# Patient Record
Sex: Male | Born: 1950 | Race: White | Hispanic: No | Marital: Single | Smoking: Light tobacco smoker
Health system: Western US, Academic
[De-identification: ages and names within clinical notes are randomized; demographics above are authoritative.]

## PROBLEM LIST (undated history)

## (undated) DIAGNOSIS — Z952 Presence of prosthetic heart valve: Secondary | ICD-10-CM

## (undated) DIAGNOSIS — J449 Chronic obstructive pulmonary disease, unspecified: Secondary | ICD-10-CM

## (undated) DIAGNOSIS — Z951 Presence of aortocoronary bypass graft: Secondary | ICD-10-CM

## (undated) DIAGNOSIS — Z95810 Presence of automatic (implantable) cardiac defibrillator: Secondary | ICD-10-CM

## (undated) DIAGNOSIS — I5022 Chronic systolic (congestive) heart failure: Secondary | ICD-10-CM

## (undated) DIAGNOSIS — I2510 Atherosclerotic heart disease of native coronary artery without angina pectoris: Secondary | ICD-10-CM

## (undated) DIAGNOSIS — I255 Ischemic cardiomyopathy: Secondary | ICD-10-CM

## (undated) DIAGNOSIS — Z7901 Long term (current) use of anticoagulants: Secondary | ICD-10-CM

## (undated) DIAGNOSIS — I739 Peripheral vascular disease, unspecified: Secondary | ICD-10-CM

## (undated) DIAGNOSIS — I6529 Occlusion and stenosis of unspecified carotid artery: Secondary | ICD-10-CM

## (undated) DIAGNOSIS — J189 Pneumonia, unspecified organism: Secondary | ICD-10-CM

## (undated) DIAGNOSIS — C801 Malignant (primary) neoplasm, unspecified: Secondary | ICD-10-CM

## (undated) DIAGNOSIS — N200 Calculus of kidney: Secondary | ICD-10-CM

## (undated) DIAGNOSIS — C61 Malignant neoplasm of prostate: Secondary | ICD-10-CM

## (undated) DIAGNOSIS — I10 Essential (primary) hypertension: Secondary | ICD-10-CM

## (undated) HISTORY — DX: Presence of prosthetic heart valve: Z95.2

## (undated) HISTORY — DX: Peripheral vascular disease, unspecified: I73.9

## (undated) HISTORY — DX: Occlusion and stenosis of unspecified carotid artery: I65.29

## (undated) HISTORY — DX: Chronic obstructive pulmonary disease, unspecified: J44.9

## (undated) HISTORY — DX: Long term (current) use of anticoagulants: Z79.01

## (undated) HISTORY — DX: Ischemic cardiomyopathy: I25.5

## (undated) HISTORY — DX: Atherosclerotic heart disease of native coronary artery without angina pectoris: I25.10

## (undated) HISTORY — DX: Presence of aortocoronary bypass graft: Z95.1

## (undated) HISTORY — DX: Presence of automatic (implantable) cardiac defibrillator: Z95.810

## (undated) HISTORY — DX: Chronic systolic (congestive) heart failure: I50.22

## (undated) HISTORY — PX: HERNIA REPAIR: SHX51

## (undated) HISTORY — PX: OTHER SURGICAL HISTORY: SHX169

## (undated) HISTORY — PX: TONSILLECTOMY: SUR1361

## (undated) HISTORY — PX: APPENDECTOMY: SHX54

---

## 2003-02-24 ENCOUNTER — Encounter: Payer: Self-pay | Admitting: General Surgery

## 2003-02-24 ENCOUNTER — Ambulatory Visit (HOSPITAL_COMMUNITY): Admission: RE | Admit: 2003-02-24 | Discharge: 2003-02-24 | Payer: Self-pay | Admitting: General Surgery

## 2011-06-08 DIAGNOSIS — Z947 Corneal transplant status: Secondary | ICD-10-CM | POA: Insufficient documentation

## 2011-06-08 DIAGNOSIS — H25819 Combined forms of age-related cataract, unspecified eye: Secondary | ICD-10-CM | POA: Insufficient documentation

## 2013-11-18 DIAGNOSIS — Z951 Presence of aortocoronary bypass graft: Secondary | ICD-10-CM

## 2013-11-18 HISTORY — PX: CABG UNSPEC # VESSELS - HX ONLY: CABG0

## 2013-11-18 HISTORY — DX: Presence of aortocoronary bypass graft: Z95.1

## 2014-10-11 DIAGNOSIS — H17813 Minor opacity of cornea, bilateral: Secondary | ICD-10-CM | POA: Insufficient documentation

## 2015-01-31 ENCOUNTER — Other Ambulatory Visit: Payer: Self-pay | Admitting: Urology

## 2015-02-28 NOTE — Patient Instructions (Signed)
Joshua Walters  02/28/2015   Your procedure is scheduled on: 03/14/2015  Report to [facility] Main  Entrance take [facility]  elevators to 3rd floor to  [place] at     0900 AM.  Call this number if you have problems the morning of surgery [phone]   Remember: ONLY 1 PERSON MAY GO WITH YOU TO SHORT STAY TO GET  READY MORNING OF [place].  Do not eat food or drink liquids :After Midnight.     Take these medicines the morning of surgery with A SIP OF WATER: none  DO NOT TAKE ANY DIABETIC MEDICATIONS DAY OF YOUR SURGERY                               You may not have any metal on your body including hair pins and              piercings  Do not wear jewelry, , lotions, powders or perfumes, deodorant                         Men may shave face and neck.   Do not bring valuables to the hospital. [place].  Contacts, dentures or bridgework may not be worn into surgery.  Leave suitcase in the car. After surgery it may be brought to your room.      Special Instructions: coughing and deep breathing exercises, leg exercises               Please read over the following fact sheets you were given: _____________________________________________________________________             [facility] - Preparing for Surgery Before surgery, you can play an important role.  Because skin is not sterile, your skin needs to be as free of germs as possible.  You can reduce the number of germs on your skin by washing with CHG (chlorahexidine gluconate) soap before surgery.  CHG is an antiseptic cleaner which kills germs and bonds with the skin to continue killing germs even after washing. Please DO NOT use if you have an allergy to CHG or antibacterial soaps.  If your skin becomes reddened/irritated stop using the CHG and inform your nurse when you arrive at Short Stay. Do not shave (including legs and underarms) for at  least 48 hours prior to the first CHG shower.  You may shave your face/neck. Please follow these instructions carefully:  1.  Shower with CHG Soap the night before surgery and the  morning of Surgery.  2.  If you choose to wash your hair, wash your hair first as usual with your  normal  shampoo.  3.  After you shampoo, rinse your hair and body thoroughly to remove the  shampoo.                           4.  Use CHG as you would any other liquid soap.  You can apply chg directly  to the skin and wash                       Gently with a scrungie or  clean washcloth.  5.  Apply the CHG Soap to your body ONLY FROM THE NECK DOWN.   Do not use on face/ open                           Wound or open sores. Avoid contact with eyes, ears mouth and genitals (private parts).                       Wash face,  Genitals (private parts) with your normal soap.             6.  Wash thoroughly, paying special attention to the area where your surgery  will be performed.  7.  Thoroughly rinse your body with warm water from the neck down.  8.  DO NOT shower/wash with your normal soap after using and rinsing off  the CHG Soap.                9.  Pat yourself dry with a clean towel.            10.  Wear clean pajamas.            11.  Place clean sheets on your bed the night of your first shower and do not  sleep with pets. Day of Surgery : Do not apply any lotions/deodorants the morning of surgery.  Please wear clean clothes to the hospital/surgery center.  FAILURE TO FOLLOW THESE INSTRUCTIONS MAY RESULT IN THE CANCELLATION OF YOUR SURGERY PATIENT SIGNATURE_________________________________  NURSE SIGNATURE__________________________________  ________________________________________________________________________  WHAT IS A BLOOD TRANSFUSION? Blood Transfusion Information  A transfusion is the replacement of blood or some of its parts. Blood is made up of multiple cells which provide different functions.  Red  blood cells carry oxygen and are used for blood loss replacement.  White blood cells fight against infection.  Platelets control bleeding.  Plasma helps clot blood.  Other blood products are available for specialized needs, such as hemophilia or other clotting disorders. BEFORE THE TRANSFUSION  Who gives blood for transfusions?   Healthy volunteers who are fully evaluated to make sure their blood is safe. This is blood bank blood. Transfusion therapy is the safest it has ever been in the practice of medicine. Before blood is taken from a donor, a complete history is taken to make sure that person has no history of diseases nor engages in risky social behavior (examples are intravenous drug use or sexual activity with multiple partners). The donor's travel history is screened to minimize risk of transmitting infections, such as malaria. The donated blood is tested for signs of infectious diseases, such as HIV and hepatitis. The blood is then tested to be sure it is compatible with you in order to minimize the chance of a transfusion reaction. If you or a relative donates blood, this is often done in anticipation of surgery and is not appropriate for emergency situations. It takes many days to process the donated blood. RISKS AND COMPLICATIONS Although transfusion therapy is very safe and saves many lives, the main dangers of transfusion include:  1. Getting an infectious disease. 2. Developing a transfusion reaction. This is an allergic reaction to something in the blood you were given. Every precaution is taken to prevent this. The decision to have a blood transfusion has been considered carefully by your caregiver before blood is given. Blood is not given unless the benefits outweigh the risks. AFTER THE TRANSFUSION  Right after receiving a blood transfusion, you will usually feel much better and more energetic. This is especially true if your red blood cells have gotten low (anemic). The  transfusion raises the level of the red blood cells which carry oxygen, and this usually causes an energy increase.  The nurse administering the transfusion will monitor you carefully for complications. HOME CARE INSTRUCTIONS  No special instructions are needed after a transfusion. You may find your energy is better. Speak with your caregiver about any limitations on activity for underlying diseases you may have. SEEK MEDICAL CARE IF:   Your condition is not improving after your transfusion.  You develop redness or irritation at the intravenous (IV) site. SEEK IMMEDIATE MEDICAL CARE IF:  Any of the following symptoms occur over the next 12 hours:  Shaking chills.  You have a temperature by mouth above 102 F (38.9 C), not controlled by medicine.  Chest, back, or muscle pain.  People around you feel you are not acting correctly or are confused.  Shortness of breath or difficulty breathing.  Dizziness and fainting.  You get a rash or develop hives.  You have a decrease in urine output.  Your urine turns a dark color or changes to pink, red, or brown. Any of the following symptoms occur over the next 10 days:  You have a temperature by mouth above 102 F (38.9 C), not controlled by medicine.  Shortness of breath.  Weakness after normal activity.  The white part of the eye turns yellow (jaundice).  You have a decrease in the amount of urine or are urinating less often.  Your urine turns a dark color or changes to pink, red, or brown. Document Released: 05/18/2000 Document Revised: 08/13/2011 Document Reviewed: 01/05/2008 ExitCare Patient Information 2014 [place].  _______________________________________________________________________  Incentive Spirometer  An incentive spirometer is a tool that can help keep your lungs clear and active. This tool measures how well you are filling your lungs with each breath. Taking long deep breaths may help reverse or  decrease the chance of developing breathing (pulmonary) problems (especially infection) following:  A long period of time when you are unable to move or be active. BEFORE THE PROCEDURE   If the spirometer includes an indicator to show your best effort, your nurse or respiratory therapist will set it to a desired goal.  If possible, sit up straight or lean slightly forward. Try not to slouch.  Hold the incentive spirometer in an upright position. INSTRUCTIONS FOR USE  3. Sit on the edge of your bed if possible, or sit up as far as you can in bed or on a chair. 4. Hold the incentive spirometer in an upright position. 5. Breathe out normally. 6. Place the mouthpiece in your mouth and seal your lips tightly around it. 7. Breathe in slowly and as deeply as possible, raising the piston or the ball toward the top of the column. 8. Hold your breath for 3-5 seconds or for as long as possible. Allow the piston or ball to fall to the bottom of the column. 9. Remove the mouthpiece from your mouth and breathe out normally. 10. Rest for a few seconds and repeat Steps 1 through 7 at least 10 times every 1-2 hours when you are awake. Take your time and take a few normal breaths between deep breaths. 11. The spirometer may include an indicator to show your best effort. Use the indicator as a goal to work toward during each repetition. 12. After  each set of 10 deep breaths, practice coughing to be sure your lungs are clear. If you have an incision (the cut made at the time of surgery), support your incision when coughing by placing a pillow or rolled up towels firmly against it. Once you are able to get out of bed, walk around indoors and cough well. You may stop using the incentive spirometer when instructed by your caregiver.  RISKS AND COMPLICATIONS  Take your time so you do not get dizzy or light-headed.  If you are in pain, you may need to take or ask for pain medication before doing incentive  spirometry. It is harder to take a deep breath if you are having pain. AFTER USE  Rest and breathe slowly and easily.  It can be helpful to keep track of a log of your progress. Your caregiver can provide you with a simple table to help with this. If you are using the spirometer at home, follow these instructions: [place] IF:   You are having difficultly using the spirometer.  You have trouble using the spirometer as often as instructed.  Your pain medication is not giving enough relief while using the spirometer.  You develop fever of 100.5 F (38.1 C) or higher. SEEK IMMEDIATE MEDICAL CARE IF:   You cough up bloody sputum that had not been present before.  You develop fever of 102 F (38.9 C) or greater.  You develop worsening pain at or near the incision site. MAKE SURE YOU:   Understand these instructions.  Will watch your condition.  Will get help right away if you are not doing well or get worse. Document Released: 10/01/2006 Document Revised: 08/13/2011 Document Reviewed: 12/02/2006 [facility] Patient Information 2014 [place], [place].   ________________________________________________________________________

## 2015-03-03 ENCOUNTER — Encounter (HOSPITAL_COMMUNITY): Payer: Self-pay

## 2015-03-03 ENCOUNTER — Encounter (HOSPITAL_COMMUNITY)
Admission: RE | Admit: 2015-03-03 | Discharge: 2015-03-03 | Disposition: A | Discharge status: Home / Self Care | Payer: BLUE CROSS/BLUE SHIELD | Source: Ambulatory Visit | Attending: Urology | Admitting: Urology

## 2015-03-03 DIAGNOSIS — Z01818 Encounter for other preprocedural examination: Secondary | ICD-10-CM | POA: Diagnosis present

## 2015-03-03 DIAGNOSIS — C61 Malignant neoplasm of prostate: Secondary | ICD-10-CM | POA: Insufficient documentation

## 2015-03-03 HISTORY — DX: Essential (primary) hypertension: I10

## 2015-03-03 HISTORY — DX: Calculus of kidney: N20.0

## 2015-03-03 HISTORY — DX: Pneumonia, unspecified organism: J18.9

## 2015-03-03 HISTORY — DX: Malignant (primary) neoplasm, unspecified: C80.1

## 2015-03-03 LAB — BASIC METABOLIC PANEL
Anion gap: 8 (ref 5–15)
BUN: 12 mg/dL (ref 6–20)
CHLORIDE: 105 mmol/L (ref 101–111)
CO2: 27 mmol/L (ref 22–32)
Calcium: 9.6 mg/dL (ref 8.9–10.3)
Creatinine, Ser: 0.89 mg/dL (ref 0.61–1.24)
GFR calc non Af Amer: >60 mL/min (ref >60)
Glucose, Bld: 105 mg/dL — ABNORMAL HIGH (ref 65–99)
POTASSIUM: 4.3 mmol/L (ref 3.5–5.1)
SODIUM: 140 mmol/L (ref 135–145)

## 2015-03-03 LAB — CBC
HEMATOCRIT: 43.9 % (ref 39.0–52.0)
Hemoglobin: 14.4 g/dL (ref 13.0–17.0)
MCH: 31.5 pg (ref 26.0–34.0)
MCHC: 32.8 g/dL (ref 30.0–36.0)
MCV: 96.1 fL (ref 78.0–100.0)
PLATELETS: 228 10*3/uL (ref 150–400)
RBC: 4.57 MIL/uL (ref 4.22–5.81)
RDW: 13 % (ref 11.5–15.5)
WBC: 6.8 10*3/uL (ref 4.0–10.5)

## 2015-03-03 LAB — TYPE AND SCREEN
ABO/RH(D): A POS
ANTIBODY SCREEN: NEGATIVE

## 2015-03-03 LAB — ABO/RH: ABO/RH(D): A POS

## 2015-03-07 NOTE — Progress Notes (Signed)
Final EKG in EPIC done 03/03/2015.

## 2015-03-11 NOTE — H&P (Signed)
Chief Complaint Prostate Cancer    History of Present Illness Joshua Walters is a 64 year old gentleman who was found to have an elevated PSA of 4.8 prompting urologic evaluation and a prostate needle biopsy on 11/25/14. This demonstrated Gleason 3+4=7 adenocarcinoma of the prostate with 6 out of 17 biopsy cores positive for malignancy. He has no family history of prostate cancer. He is very healthy with hypertension being his only medical problem. He is well informed about his treatment options after prior discussion with Dr. Exie Parody. He is kindly seen today at the request of Dr. Exie Parody specifically to consider surgical treatment. Mr. and Mrs. Kassim do have full custody of their 52-year-old daughter who has Down's syndrome and his priority is to proceed with aggressive therapy in order to prolong his quantity of life.     TNM stage: cT1c Nx Mx  PSA: 4.8  Gleason score: 3+4=7  Biopsy (01/21/15): 6/17 cores (20% of tissue overall)    Left: L apex (50%), L mid (70%), L base (5-10%)    Right: No malignancy  Prostate volume: 43.6 cc    Nomogram  OC disease: 48%  EPE: 51%  SVI: 2%  LNI: 2%  PFS (surgery): 90% at 5 years, 83% at 10 years    Urinary function: He does take tamsulosin although states that he has fairly minimal lower urinary tract symptoms. IPSS is 2.  Erectile function: He does have mild erectile dysfunction although candidate came adequate erections on a regular basis without the need for medication. SHIM score is 19.   Past Medical History Problems  1. History of hyperlipidemia (Z86.39) 2. History of hypertension (Z86.79)  Surgical History Problems  1. History of Appendectomy 2. History of Cornea Transplant 3. History of Hernia Repair 4. History of Tonsillectomy  Current Meds 1. Aspir-81 81 MG Oral Tablet Delayed Release;  Therapy: (Recorded:26Aug2016) to Recorded 2. Fish Oil Concentrate 1000 MG Oral Capsule;  Therapy: (Recorded:26Aug2016) to  Recorded 3. Lisinopril 10 MG Oral Tablet;  Therapy: (Recorded:26Aug2016) to Recorded 4. Multi-Vitamin TABS;  Therapy: (Recorded:26Aug2016) to Recorded 5. Simvastatin 40 MG Oral Tablet;  Therapy: (Recorded:26Aug2016) to Recorded 6. Tamsulosin HCl - 0.4 MG Oral Capsule;  Therapy: (Recorded:26Aug2016) to Recorded 7. Vitamin D-3 5000 UNIT TABS;  Therapy: (Recorded:26Aug2016) to Recorded  Allergies Medication  1. Penicillins 2. Sulfa Drugs  Family History Problems  1. Family history of colon cancer (Z80.0) : Mother 2. Family history of Systemic inflammatory response syndrome (SIRS) of noninfectious  origin without organ failure : Father  Social History Problems    Alcohol use (Z78.9)   couple of beers, not every day   Former smoker [phone])   quit smoking 2004   Married  Review of Systems Genitourinary, constitutional, skin, eye, otolaryngeal, hematologic/lymphatic, cardiovascular, pulmonary, endocrine, musculoskeletal, gastrointestinal, neurological and psychiatric system(s) were reviewed and pertinent findings if present are noted and are otherwise negative.      Physical Exam Constitutional: Well nourished and well developed . No acute distress.  ENT:. The ears and nose are normal in appearance.  Neck: The appearance of the neck is normal and no neck mass is present.  Pulmonary: No respiratory distress, normal respiratory rhythm and effort and clear bilateral breath sounds.  Cardiovascular: Heart rate and rhythm are normal . No peripheral edema.  Abdomen: Incision site(s) well healed. The abdomen is soft and nontender. No masses are palpated. No CVA tenderness. No hernias are palpable. No hepatosplenomegaly noted.   Assessment Assessed  1. Prostate cancer (C61)  Discussion/Summary 1. Prostate cancer:    Joshua Walters feels very well informed. He does adamantly wish to proceed with surgical treatment. He will undergo a bilateral nerve sparing robot-assisted  laparoscopic radical prostatectomy and bilateral pelvic lymphadenectomy.

## 2015-03-13 MED ORDER — VANCOMYCIN HCL IN DEXTROSE 1-5 GM/200ML-% IV SOLN
1000.0000 mg | INTRAVENOUS | Status: AC
  Administered 2015-03-14: 1000 mg via INTRAVENOUS
  Filled 2015-03-13: qty 200

## 2015-03-14 ENCOUNTER — Inpatient Hospital Stay (HOSPITAL_COMMUNITY): Payer: BLUE CROSS/BLUE SHIELD | Admitting: Anesthesiology

## 2015-03-14 ENCOUNTER — Inpatient Hospital Stay (HOSPITAL_COMMUNITY)
Admission: RE | Admit: 2015-03-14 | Discharge: 2015-03-15 | DRG: 708 | Disposition: A | Discharge status: Home / Self Care | Payer: BLUE CROSS/BLUE SHIELD | Source: Ambulatory Visit | Attending: Urology | Admitting: Urology

## 2015-03-14 ENCOUNTER — Encounter (HOSPITAL_COMMUNITY)
Admission: RE | Disposition: A | Discharge status: Home / Self Care | Payer: Self-pay | Source: Ambulatory Visit | Attending: Urology

## 2015-03-14 ENCOUNTER — Encounter (HOSPITAL_COMMUNITY): Payer: Self-pay

## 2015-03-14 DIAGNOSIS — Z947 Corneal transplant status: Secondary | ICD-10-CM | POA: Diagnosis not present

## 2015-03-14 DIAGNOSIS — Z800 Family history of malignant neoplasm of digestive organs: Secondary | ICD-10-CM | POA: Diagnosis not present

## 2015-03-14 DIAGNOSIS — Z01810 Encounter for preprocedural cardiovascular examination: Secondary | ICD-10-CM | POA: Diagnosis not present

## 2015-03-14 DIAGNOSIS — Z79899 Other long term (current) drug therapy: Secondary | ICD-10-CM

## 2015-03-14 DIAGNOSIS — C61 Malignant neoplasm of prostate: Secondary | ICD-10-CM | POA: Diagnosis present

## 2015-03-14 DIAGNOSIS — Z882 Allergy status to sulfonamides status: Secondary | ICD-10-CM | POA: Diagnosis not present

## 2015-03-14 DIAGNOSIS — Z23 Encounter for immunization: Secondary | ICD-10-CM | POA: Diagnosis not present

## 2015-03-14 DIAGNOSIS — Z7982 Long term (current) use of aspirin: Secondary | ICD-10-CM

## 2015-03-14 DIAGNOSIS — Z9049 Acquired absence of other specified parts of digestive tract: Secondary | ICD-10-CM | POA: Diagnosis not present

## 2015-03-14 DIAGNOSIS — I10 Essential (primary) hypertension: Secondary | ICD-10-CM | POA: Diagnosis present

## 2015-03-14 DIAGNOSIS — Z87891 Personal history of nicotine dependence: Secondary | ICD-10-CM | POA: Diagnosis not present

## 2015-03-14 DIAGNOSIS — Z880 Allergy status to penicillin: Secondary | ICD-10-CM | POA: Diagnosis not present

## 2015-03-14 DIAGNOSIS — N529 Male erectile dysfunction, unspecified: Secondary | ICD-10-CM | POA: Diagnosis present

## 2015-03-14 DIAGNOSIS — Z01812 Encounter for preprocedural laboratory examination: Secondary | ICD-10-CM | POA: Diagnosis not present

## 2015-03-14 DIAGNOSIS — E785 Hyperlipidemia, unspecified: Secondary | ICD-10-CM | POA: Diagnosis present

## 2015-03-14 HISTORY — PX: ROBOT ASSISTED LAPAROSCOPIC RADICAL PROSTATECTOMY: SHX5141

## 2015-03-14 HISTORY — PX: LYMPHADENECTOMY: SHX5960

## 2015-03-14 LAB — HEMOGLOBIN AND HEMATOCRIT, BLOOD
HCT: 43 % (ref 39.0–52.0)
HEMOGLOBIN: 13.7 g/dL (ref 13.0–17.0)

## 2015-03-14 SURGERY — ROBOTIC ASSISTED LAPAROSCOPIC RADICAL PROSTATECTOMY LEVEL 2
Anesthesia: General

## 2015-03-14 MED ORDER — PROPOFOL 10 MG/ML IV BOLUS
INTRAVENOUS | Status: AC
  Filled 2015-03-14: qty 20

## 2015-03-14 MED ORDER — HYDROMORPHONE HCL 1 MG/ML IJ SOLN: 0.2500 mg | INTRAMUSCULAR | Status: DC | PRN

## 2015-03-14 MED ORDER — HYDROMORPHONE HCL 1 MG/ML IJ SOLN
INTRAMUSCULAR | Status: DC | PRN
  Administered 2015-03-14 (×2): 1 mg via INTRAVENOUS

## 2015-03-14 MED ORDER — SIMVASTATIN 40 MG PO TABS
40.0000 mg | ORAL_TABLET | Freq: Every day | ORAL | Status: DC
  Administered 2015-03-14: 40 mg via ORAL
  Filled 2015-03-14: qty 1

## 2015-03-14 MED ORDER — BUPIVACAINE-EPINEPHRINE 0.25% -1:200000 IJ SOLN
INTRAMUSCULAR | Status: DC | PRN
  Administered 2015-03-14: 20 mL

## 2015-03-14 MED ORDER — DEXAMETHASONE SODIUM PHOSPHATE 10 MG/ML IJ SOLN
INTRAMUSCULAR | Status: DC | PRN
  Administered 2015-03-14: 10 mg via INTRAVENOUS

## 2015-03-14 MED ORDER — SODIUM CHLORIDE 0.9 % IV BOLUS (SEPSIS)
1000.0000 mL | Freq: Once | INTRAVENOUS | Status: AC
  Administered 2015-03-14: 1000 mL via INTRAVENOUS

## 2015-03-14 MED ORDER — DOCUSATE SODIUM 100 MG PO CAPS
100.0000 mg | ORAL_CAPSULE | Freq: Two times a day (BID) | ORAL | Status: DC
  Administered 2015-03-14 – 2015-03-15 (×2): 100 mg via ORAL
  Filled 2015-03-14 (×2): qty 1

## 2015-03-14 MED ORDER — PROMETHAZINE HCL 25 MG/ML IJ SOLN: 6.2500 mg | INTRAMUSCULAR | Status: DC | PRN

## 2015-03-14 MED ORDER — VANCOMYCIN HCL IN DEXTROSE 1-5 GM/200ML-% IV SOLN
1000.0000 mg | Freq: Two times a day (BID) | INTRAVENOUS | Status: AC
  Administered 2015-03-14: 1000 mg via INTRAVENOUS
  Filled 2015-03-14: qty 200

## 2015-03-14 MED ORDER — MORPHINE SULFATE (PF) 2 MG/ML IV SOLN: 2.0000 mg | INTRAVENOUS | Status: DC | PRN

## 2015-03-14 MED ORDER — PROPOFOL 10 MG/ML IV BOLUS
INTRAVENOUS | Status: DC | PRN
  Administered 2015-03-14: 300 mg via INTRAVENOUS

## 2015-03-14 MED ORDER — STERILE WATER FOR IRRIGATION IR SOLN
Status: DC | PRN
  Administered 2015-03-14: 1000 mL

## 2015-03-14 MED ORDER — ACETAMINOPHEN 325 MG PO TABS: 650.0000 mg | ORAL_TABLET | ORAL | Status: DC | PRN

## 2015-03-14 MED ORDER — PHENYLEPHRINE HCL 10 MG/ML IJ SOLN
INTRAMUSCULAR | Status: DC | PRN
Start: 2015-03-14 — End: 2015-03-14
  Administered 2015-03-14 (×5): 80 ug via INTRAVENOUS

## 2015-03-14 MED ORDER — LACTATED RINGERS IV SOLN
INTRAVENOUS | Status: DC | PRN
  Administered 2015-03-14: 12:00:00

## 2015-03-14 MED ORDER — PHENYLEPHRINE HCL 10 MG/ML IJ SOLN
INTRAMUSCULAR | Status: AC
  Filled 2015-03-14: qty 1

## 2015-03-14 MED ORDER — FENTANYL CITRATE (PF) 250 MCG/5ML IJ SOLN
INTRAMUSCULAR | Status: AC
  Filled 2015-03-14: qty 25

## 2015-03-14 MED ORDER — KETOROLAC TROMETHAMINE 15 MG/ML IJ SOLN
15.0000 mg | Freq: Four times a day (QID) | INTRAMUSCULAR | Status: DC
  Administered 2015-03-14 – 2015-03-15 (×4): 15 mg via INTRAVENOUS
  Filled 2015-03-14 (×4): qty 1

## 2015-03-14 MED ORDER — CIPROFLOXACIN HCL 500 MG PO TABS: 500.0000 mg | ORAL_TABLET | Freq: Two times a day (BID) | ORAL | Status: DC

## 2015-03-14 MED ORDER — ROCURONIUM BROMIDE 100 MG/10ML IV SOLN
INTRAVENOUS | Status: AC
  Filled 2015-03-14: qty 1

## 2015-03-14 MED ORDER — FENTANYL CITRATE (PF) 100 MCG/2ML IJ SOLN
INTRAMUSCULAR | Status: AC
  Filled 2015-03-14: qty 4

## 2015-03-14 MED ORDER — MIDAZOLAM HCL 2 MG/2ML IJ SOLN
INTRAMUSCULAR | Status: AC
  Filled 2015-03-14: qty 4

## 2015-03-14 MED ORDER — OXYCODONE HCL 5 MG PO TABS: 5.0000 mg | ORAL_TABLET | Freq: Once | ORAL | Status: DC | PRN

## 2015-03-14 MED ORDER — LIDOCAINE HCL (CARDIAC) 20 MG/ML IV SOLN
INTRAVENOUS | Status: DC | PRN
  Administered 2015-03-14: 100 mg via INTRAVENOUS

## 2015-03-14 MED ORDER — HEPARIN SODIUM (PORCINE) 1000 UNIT/ML IJ SOLN
INTRAMUSCULAR | Status: AC
  Filled 2015-03-14: qty 1

## 2015-03-14 MED ORDER — LACTATED RINGERS IV SOLN: INTRAVENOUS | Status: DC

## 2015-03-14 MED ORDER — BUPIVACAINE-EPINEPHRINE (PF) 0.25% -1:200000 IJ SOLN
INTRAMUSCULAR | Status: AC
  Filled 2015-03-14: qty 30

## 2015-03-14 MED ORDER — SODIUM CHLORIDE 0.9 % IR SOLN
Status: DC | PRN
  Administered 2015-03-14: 1000 mL via INTRAVESICAL

## 2015-03-14 MED ORDER — ROCURONIUM BROMIDE 100 MG/10ML IV SOLN
INTRAVENOUS | Status: DC | PRN
  Administered 2015-03-14 (×5): 10 mg via INTRAVENOUS
  Administered 2015-03-14: 50 mg via INTRAVENOUS

## 2015-03-14 MED ORDER — OXYCODONE HCL 5 MG/5ML PO SOLN: 5.0000 mg | Freq: Once | ORAL | Status: DC | PRN

## 2015-03-14 MED ORDER — ONDANSETRON HCL 4 MG/2ML IJ SOLN
INTRAMUSCULAR | Status: AC
  Filled 2015-03-14: qty 2

## 2015-03-14 MED ORDER — HYDROCODONE-ACETAMINOPHEN 5-325 MG PO TABS: 1.0000 | ORAL_TABLET | Freq: Four times a day (QID) | ORAL | Status: DC | PRN

## 2015-03-14 MED ORDER — LACTATED RINGERS IV SOLN
INTRAVENOUS | Status: DC
  Administered 2015-03-14 (×3): via INTRAVENOUS

## 2015-03-14 MED ORDER — LIDOCAINE HCL (CARDIAC) 20 MG/ML IV SOLN
INTRAVENOUS | Status: AC
  Filled 2015-03-14: qty 5

## 2015-03-14 MED ORDER — SUGAMMADEX SODIUM 200 MG/2ML IV SOLN
INTRAVENOUS | Status: DC | PRN
  Administered 2015-03-14: 400 mg via INTRAVENOUS

## 2015-03-14 MED ORDER — DIPHENHYDRAMINE HCL 12.5 MG/5ML PO ELIX: 12.5000 mg | ORAL_SOLUTION | Freq: Four times a day (QID) | ORAL | Status: DC | PRN

## 2015-03-14 MED ORDER — SUCCINYLCHOLINE CHLORIDE 20 MG/ML IJ SOLN
INTRAMUSCULAR | Status: DC | PRN
  Administered 2015-03-14: 140 mg via INTRAVENOUS

## 2015-03-14 MED ORDER — POLYVINYL ALCOHOL 1.4 % OP SOLN
1.0000 [drp] | OPHTHALMIC | Status: DC | PRN
  Administered 2015-03-14: 1 [drp] via OPHTHALMIC
  Filled 2015-03-14: qty 15

## 2015-03-14 MED ORDER — KCL IN DEXTROSE-NACL 20-5-0.45 MEQ/L-%-% IV SOLN
INTRAVENOUS | Status: DC
  Administered 2015-03-14 – 2015-03-15 (×2): via INTRAVENOUS
  Filled 2015-03-14 (×4): qty 1000

## 2015-03-14 MED ORDER — DIPHENHYDRAMINE HCL 50 MG/ML IJ SOLN: 12.5000 mg | Freq: Four times a day (QID) | INTRAMUSCULAR | Status: DC | PRN

## 2015-03-14 MED ORDER — MIDAZOLAM HCL 5 MG/5ML IJ SOLN
INTRAMUSCULAR | Status: DC | PRN
  Administered 2015-03-14: 2 mg via INTRAVENOUS

## 2015-03-14 MED ORDER — HYDROMORPHONE HCL 2 MG/ML IJ SOLN
INTRAMUSCULAR | Status: AC
  Filled 2015-03-14: qty 1

## 2015-03-14 MED ORDER — SODIUM CHLORIDE 0.9 % IV SOLN
10.0000 mg | INTRAVENOUS | Status: DC | PRN
  Administered 2015-03-14: 50 ug/min via INTRAVENOUS

## 2015-03-14 MED ORDER — LISINOPRIL 10 MG PO TABS
10.0000 mg | ORAL_TABLET | Freq: Every day | ORAL | Status: DC
  Administered 2015-03-14: 10 mg via ORAL
  Filled 2015-03-14: qty 1

## 2015-03-14 MED ORDER — VANCOMYCIN HCL IN DEXTROSE 1-5 GM/200ML-% IV SOLN
INTRAVENOUS | Status: AC
  Filled 2015-03-14: qty 200

## 2015-03-14 MED ORDER — INFLUENZA VAC SPLIT QUAD 0.5 ML IM SUSY
0.5000 mL | PREFILLED_SYRINGE | INTRAMUSCULAR | Status: AC
  Administered 2015-03-15: 0.5 mL via INTRAMUSCULAR
  Filled 2015-03-14 (×2): qty 0.5

## 2015-03-14 MED ORDER — DEXAMETHASONE SODIUM PHOSPHATE 10 MG/ML IJ SOLN
INTRAMUSCULAR | Status: AC
  Filled 2015-03-14: qty 1

## 2015-03-14 MED ORDER — ONDANSETRON HCL 4 MG/2ML IJ SOLN
INTRAMUSCULAR | Status: DC | PRN
  Administered 2015-03-14: 4 mg via INTRAVENOUS

## 2015-03-14 MED ORDER — PHENYLEPHRINE 40 MCG/ML (10ML) SYRINGE FOR IV PUSH (FOR BLOOD PRESSURE SUPPORT)
PREFILLED_SYRINGE | INTRAVENOUS | Status: AC
  Filled 2015-03-14: qty 10

## 2015-03-14 MED ORDER — SUGAMMADEX SODIUM 500 MG/5ML IV SOLN
INTRAVENOUS | Status: AC
  Filled 2015-03-14: qty 5

## 2015-03-14 MED ORDER — FENTANYL CITRATE (PF) 100 MCG/2ML IJ SOLN
INTRAMUSCULAR | Status: DC | PRN
  Administered 2015-03-14: 50 ug via INTRAVENOUS
  Administered 2015-03-14: 100 ug via INTRAVENOUS
  Administered 2015-03-14: 50 ug via INTRAVENOUS
  Administered 2015-03-14: 100 ug via INTRAVENOUS
  Administered 2015-03-14: 50 ug via INTRAVENOUS

## 2015-03-14 SURGICAL SUPPLY — 51 items
CABLE HIGH FREQUENCY MONO STRZ (ELECTRODE) ×3 IMPLANT
CATH FOLEY 2WAY SLVR 18FR 30CC (CATHETERS) ×3 IMPLANT
CATH ROBINSON RED A/P 16FR (CATHETERS) ×3 IMPLANT
CATH ROBINSON RED A/P 8FR (CATHETERS) ×3 IMPLANT
CATH TIEMANN FOLEY 18FR 5CC (CATHETERS) ×3 IMPLANT
CHLORAPREP W/TINT 26ML (MISCELLANEOUS) ×3 IMPLANT
CLIP LIGATING HEM O LOK PURPLE (MISCELLANEOUS) ×6 IMPLANT
CLOTH BEACON ORANGE TIMEOUT ST (SAFETY) ×3 IMPLANT
COVER SURGICAL LIGHT HANDLE (MISCELLANEOUS) ×3 IMPLANT
COVER TIP SHEARS 8 DVNC (MISCELLANEOUS) ×2 IMPLANT
COVER TIP SHEARS 8MM DA VINCI (MISCELLANEOUS) ×1
CUTTER ECHEON FLEX ENDO 45 340 (ENDOMECHANICALS) ×3 IMPLANT
DECANTER SPIKE VIAL GLASS SM (MISCELLANEOUS) IMPLANT
DRAPE SURG IRRIG POUCH 19X23 (DRAPES) ×3 IMPLANT
DRSG TEGADERM 4X4.75 (GAUZE/BANDAGES/DRESSINGS) ×3 IMPLANT
DRSG TEGADERM 6X8 (GAUZE/BANDAGES/DRESSINGS) IMPLANT
ELECT REM PT RETURN 9FT ADLT (ELECTROSURGICAL) ×3
ELECTRODE REM PT RTRN 9FT ADLT (ELECTROSURGICAL) ×2 IMPLANT
GLOVE BIO SURGEON STRL SZ 6.5 (GLOVE) ×6 IMPLANT
GLOVE BIOGEL M STRL SZ7.5 (GLOVE) ×6 IMPLANT
GOWN STRL REUS W/TWL LRG LVL3 (GOWN DISPOSABLE) ×9 IMPLANT
HOLDER FOLEY CATH W/STRAP (MISCELLANEOUS) ×3 IMPLANT
IV LACTATED RINGERS 1000ML (IV SOLUTION) IMPLANT
KIT ACCESSORY DA VINCI DISP (KITS) ×1
KIT ACCESSORY DVNC DISP (KITS) ×2 IMPLANT
LIQUID BAND (GAUZE/BANDAGES/DRESSINGS) ×3 IMPLANT
MANIFOLD NEPTUNE II (INSTRUMENTS) ×3 IMPLANT
NDL SAFETY ECLIPSE 18X1.5 (NEEDLE) ×2 IMPLANT
NEEDLE HYPO 18GX1.5 SHARP (NEEDLE) ×1
PACK ROBOT UROLOGY CUSTOM (CUSTOM PROCEDURE TRAY) ×3 IMPLANT
RELOAD GREEN ECHELON 45 (STAPLE) ×3 IMPLANT
SET TUBE IRRIG SUCTION NO TIP (IRRIGATION / IRRIGATOR) ×3 IMPLANT
SHEET LAVH (DRAPES) ×3 IMPLANT
SOLUTION ELECTROLUBE (MISCELLANEOUS) ×3 IMPLANT
SUT ETHILON 3 0 PS 1 (SUTURE) ×3 IMPLANT
SUT MNCRL 3 0 RB1 (SUTURE) ×2 IMPLANT
SUT MNCRL 3 0 VIOLET RB1 (SUTURE) ×2 IMPLANT
SUT MNCRL AB 4-0 PS2 18 (SUTURE) ×6 IMPLANT
SUT MONOCRYL 3 0 RB1 (SUTURE) ×2
SUT VIC AB 0 CT1 27 (SUTURE) ×1
SUT VIC AB 0 CT1 27XBRD ANTBC (SUTURE) ×2 IMPLANT
SUT VIC AB 0 UR5 27 (SUTURE) ×3 IMPLANT
SUT VIC AB 2-0 SH 27 (SUTURE) ×2
SUT VIC AB 2-0 SH 27X BRD (SUTURE) ×4 IMPLANT
SUT VIC AB 3-0 SH 27 (SUTURE) ×1
SUT VIC AB 3-0 SH 27X BRD (SUTURE) ×2 IMPLANT
SUT VICRYL 0 UR6 27IN ABS (SUTURE) ×6 IMPLANT
SYR 27GX1/2 1ML LL SAFETY (SYRINGE) ×3 IMPLANT
TOWEL OR 17X26 10 PK STRL BLUE (TOWEL DISPOSABLE) ×3 IMPLANT
TOWEL OR NON WOVEN STRL DISP B (DISPOSABLE) ×3 IMPLANT
WATER STERILE IRR 1500ML POUR (IV SOLUTION) IMPLANT

## 2015-03-14 NOTE — Transfer of Care (Signed)
Immediate Anesthesia Transfer of Care Note  Patient: Joshua Walters  Procedure(s) Performed: Procedure(s): ROBOTIC ASSISTED LAPAROSCOPIC RADICAL PROSTATECTOMY LEVEL 2 (N/A) PELVIC LYMPHADENECTOMY (Bilateral)  Patient Location: PACU  Anesthesia Type:General  Level of Consciousness: awake, alert , oriented and patient cooperative  Airway & Oxygen Therapy: Patient Spontanous Breathing and Patient connected to face mask oxygen  Post-op Assessment: Report given to RN, Post -op Vital signs reviewed and stable and Patient moving all extremities  Post vital signs: Reviewed and stable  Last Vitals:  Filed Vitals:   03/14/15 0842  BP: 157/85  Pulse: 92  Temp: 36.6 C  Resp: 18    Complications: No apparent anesthesia complications

## 2015-03-14 NOTE — Discharge Instructions (Signed)

## 2015-03-14 NOTE — Anesthesia Procedure Notes (Signed)
Procedure Name: Intubation Date/Time: 03/14/2015 11:25 AM Performed by: Lind Covert Pre-anesthesia Checklist: Patient identified, Emergency Drugs available, Suction available, Patient being monitored and Timeout performed Patient Re-evaluated:Patient Re-evaluated prior to inductionOxygen Delivery Method: Circle system utilized Preoxygenation: Pre-oxygenation with 100% oxygen Intubation Type: IV induction Laryngoscope Size: Mac and 4 Grade View: Grade I Tube type: Oral Tube size: 7.5 mm Number of attempts: 1 Airway Equipment and Method: Stylet Placement Confirmation: ETT inserted through vocal cords under direct vision,  positive ETCO2 and breath sounds checked- equal and bilateral Secured at: 23 cm Tube secured with: Tape Dental Injury: Teeth and Oropharynx as per pre-operative assessment

## 2015-03-14 NOTE — Anesthesia Preprocedure Evaluation (Addendum)
Anesthesia Evaluation  Patient identified by MRN, date of birth, ID band Patient awake    Reviewed: Allergy & Precautions, H&P , NPO status , Patient's Chart, lab work & pertinent test results  History of Anesthesia Complications Negative for: history of anesthetic complications  Airway Mallampati: I  TM Distance: >3 FB Neck ROM: full    Dental no notable dental hx. (+) Edentulous Upper, Poor Dentition, Partial Lower   Pulmonary former smoker,    Pulmonary exam normal breath sounds clear to auscultation       Cardiovascular hypertension, Pt. on medications Normal cardiovascular exam Rhythm:regular Rate:Normal     Neuro/Psych negative neurological ROS     GI/Hepatic negative GI ROS, Neg liver ROS,   Endo/Other  negative endocrine ROS  Renal/GU Renal disease     Musculoskeletal   Abdominal (+) + obese,   Peds  Hematology negative hematology ROS (+)   Anesthesia Other Findings   Reproductive/Obstetrics negative OB ROS                            Anesthesia Physical Anesthesia Plan  ASA: II  Anesthesia Plan: General   Post-op Pain Management:    Induction: Intravenous  Airway Management Planned: Oral ETT  Additional Equipment:   Intra-op Plan:   Post-operative Plan: Extubation in OR  Informed Consent: I have reviewed the patients History and Physical, chart, labs and discussed the procedure including the risks, benefits and alternatives for the proposed anesthesia with the patient or authorized representative who has indicated his/her understanding and acceptance.   Dental Advisory Given  Plan Discussed with: Anesthesiologist, CRNA and Surgeon  Anesthesia Plan Comments:         Anesthesia Quick Evaluation

## 2015-03-14 NOTE — Progress Notes (Signed)
Received pt from PACU, alert and oriented, rates pain 2/10 scale. F/C intact, oriented to unit, call light placed with in reach

## 2015-03-14 NOTE — Anesthesia Postprocedure Evaluation (Signed)
  Anesthesia Post-op Note  Patient: Joshua Walters  Procedure(s) Performed: Procedure(s): ROBOTIC ASSISTED LAPAROSCOPIC RADICAL PROSTATECTOMY LEVEL 2 (N/A) PELVIC LYMPHADENECTOMY (Bilateral)  Patient Location: PACU  Anesthesia Type:General  Level of Consciousness: awake and alert   Airway and Oxygen Therapy: Patient Spontanous Breathing  Post-op Pain: Controlled  Post-op Assessment: Post-op Vital signs reviewed, Patient's Cardiovascular Status Stable and Respiratory Function Stable  Post-op Vital Signs: Reviewed  Filed Vitals:   03/14/15 1630  BP: 137/74  Pulse: 104  Temp: 36.9 C  Resp: 10    Complications: No apparent anesthesia complications

## 2015-03-14 NOTE — Progress Notes (Signed)
Patient ID: Joshua Walters, male   DOB: 11-01-1950, 64 y.o.   MRN: [id number]  Post-op note  Subjective: The patient is doing well.  No complaints.  Objective: Vital signs in last 24 hours: Temp:  [97.7 F (36.5 C)-98.5 F (36.9 C)] 98.1 F (36.7 C) (10/10 1823) Pulse Rate:  [92-121] 121 (10/10 1823) Resp:  [10-19] 18 (10/10 1823) BP: (127-163)/(68-85) 127/82 mmHg (10/10 1823) SpO2:  [95 %-100 %] 95 % (10/10 1823) Weight:  [108.863 kg (240 lb)] 108.863 kg (240 lb) (10/10 0848)  Intake/Output from previous day:   Intake/Output this shift: Total I/O In: 2950 [I.V.:1950; IV Piggyback:1000] Out: 545 [Urine:120; Drains:50; Blood:375]  Physical Exam:  General: Alert and oriented. Abdomen: Soft, Nondistended. Incisions: Clean and dry. GU: Urine is clear.  Lab Results:  Recent Labs  03/14/15 1553  HGB 13.7  HCT 43.0    Assessment/Plan: POD#0   1) Continue to monitor, IS, ambulate at least twice tonight   [name]. MD   LOS: 0 days   Joshua Walters,LES 03/14/2015, 6:33 PM

## 2015-03-14 NOTE — Progress Notes (Signed)
Brief Pharmacy note: Pharmacy may adjust antibiotics.  Current antibiotic Vancomycin x 1 dose post-op, no adjustment necessary.  Pharmacy will sign off protocol, if further antibiotics ordered, we will adjust if needed. Current renal function well within normal limits  Thank you ,  Minda Ditto PharmD Pager [phone] 03/14/2015, 5:52 PM

## 2015-03-14 NOTE — Op Note (Signed)

## 2015-03-15 ENCOUNTER — Encounter (HOSPITAL_COMMUNITY): Payer: Self-pay | Admitting: Urology

## 2015-03-15 LAB — HEMOGLOBIN AND HEMATOCRIT, BLOOD
HEMATOCRIT: 37.8 % — AB (ref 39.0–52.0)
HEMOGLOBIN: 12.2 g/dL — AB (ref 13.0–17.0)

## 2015-03-15 MED ORDER — HYDROCODONE-ACETAMINOPHEN 5-325 MG PO TABS: 1.0000 | ORAL_TABLET | Freq: Four times a day (QID) | ORAL | Status: DC | PRN

## 2015-03-15 MED ORDER — BISACODYL 10 MG RE SUPP
10.0000 mg | Freq: Once | RECTAL | Status: AC
  Administered 2015-03-15: 10 mg via RECTAL
  Filled 2015-03-15: qty 1

## 2015-03-15 NOTE — Discharge Summary (Signed)
  Date of admission: 03/14/2015  Date of discharge: 03/15/2015  Admission diagnosis: Prostate Cancer  Discharge diagnosis: Prostate Cancer  History and Physical: For full details, please see admission history and physical. Briefly, Joshua Walters is a 64 y.o. gentleman with localized prostate cancer.  After discussing management/treatment options, he elected to proceed with surgical treatment.  Hospital Course: Joshua Walters was taken to the operating room on 03/14/2015 and underwent a robotic assisted laparoscopic radical prostatectomy. He tolerated this procedure well and without complications. Postoperatively, he was able to be transferred to a regular hospital room following recovery from anesthesia.  He was able to begin ambulating the night of surgery. He remained hemodynamically stable overnight.  He had excellent urine output with appropriately minimal output from his pelvic drain and his pelvic drain was removed on POD #1.  He was transitioned to oral pain medication, tolerated a clear liquid diet, and had met all discharge criteria and was able to be discharged home later on POD#1.  Laboratory values:  Recent Labs  03/14/15 1553 03/15/15 0513  HGB 13.7 12.2*  HCT 43.0 37.8*    Disposition: Home  Discharge instruction: He was instructed to be ambulatory but to refrain from heavy lifting, strenuous activity, or driving. He was instructed on urethral catheter care.  Discharge medications:     Medication List    STOP taking these medications        aspirin EC 81 MG tablet     Fish Oil 1200 MG Caps     multivitamin with minerals Tabs tablet     tamsulosin 0.4 MG Caps capsule  Commonly known as:  FLOMAX     VITAMIN D-3 PO      TAKE these medications        acetaminophen 500 MG tablet  Commonly known as:  TYLENOL  Take 1,000 mg by mouth every 6 (six) hours as needed for moderate pain.     ciprofloxacin 500 MG tablet  Commonly known as:  CIPRO  Take 1  tablet (500 mg total) by mouth 2 (two) times daily. Start day prior to office visit for foley removal     HYDROcodone-acetaminophen 5-325 MG tablet  Commonly known as:  NORCO  Take 1-2 tablets by mouth every 6 (six) hours as needed.     lisinopril 10 MG tablet  Commonly known as:  PRINIVIL,ZESTRIL  Take 10 mg by mouth at bedtime.     simvastatin 40 MG tablet  Commonly known as:  ZOCOR  Take 40 mg by mouth at bedtime.        Followup: He will followup in 1 week for catheter removal and to discuss his surgical pathology results.

## 2015-03-15 NOTE — Progress Notes (Signed)
Patient ID: Joshua Walters, male   DOB: 11-01-1950, 64 y.o.   MRN: [id number]  1 Day Post-Op Subjective: The patient is doing well.  No nausea or vomiting. Pain is adequately controlled.  Objective: Vital signs in last 24 hours: Temp:  [97.7 F (36.5 C)-98.9 F (37.2 C)] 98.4 F (36.9 C) (10/11 0541) Pulse Rate:  [84-121] 84 (10/11 0541) Resp:  [10-20] 20 (10/11 0541) BP: (103-163)/(57-85) 116/62 mmHg (10/11 0541) SpO2:  [94 %-100 %] 98 % (10/11 0541) Weight:  [108.863 kg (240 lb)] 108.863 kg (240 lb) (10/10 0848)  Intake/Output from previous day: 10/10 0701 - 10/11 0700 In: 4952.5 [P.O.:240; I.V.:3712.5; IV Piggyback:1000] Out: 5361 [Urine:3520; Drains:137; Blood:375] Intake/Output this shift:    Physical Exam:  General: Alert and oriented. CV: RRR Lungs: Clear bilaterally. GI: Soft, Nondistended. Incisions: Clean, dry, and intact Urine: Clear Extremities: Nontender, no erythema, no edema.  Lab Results:  Recent Labs  03/14/15 1553 03/15/15 0513  HGB 13.7 12.2*  HCT 43.0 37.8*      Assessment/Plan: POD# 1 s/p robotic prostatectomy.  1) SL IVF 2) Ambulate, Incentive spirometry 3) Transition to oral pain medication 4) Dulcolax suppository 5) D/C pelvic drain 6) Plan for likely discharge later today   [name]. MD   LOS: 1 day   [name],LES 03/15/2015, 7:11 AM

## 2015-11-03 DIAGNOSIS — Z95810 Presence of automatic (implantable) cardiac defibrillator: Secondary | ICD-10-CM

## 2015-11-03 HISTORY — DX: Presence of automatic (implantable) cardiac defibrillator: Z95.810

## 2015-11-03 HISTORY — PX: HC BI-V ICD DEVICE: 275000019

## 2016-04-08 ENCOUNTER — Emergency Department
Admission: EM | Admit: 2016-04-08 | Discharge: 2016-04-08 | Disposition: A | Discharge status: Home / Self Care | Payer: Medicaid (Managed Care) | Attending: Emergency Medicine | Admitting: Emergency Medicine

## 2016-04-08 DIAGNOSIS — R4182 Altered mental status, unspecified: Secondary | ICD-10-CM

## 2016-04-08 DIAGNOSIS — Y92018 Other place in single-family (private) house as the place of occurrence of the external cause: Secondary | ICD-10-CM

## 2016-04-08 DIAGNOSIS — T402X1A Poisoning by other opioids, accidental (unintentional), initial encounter: Principal | ICD-10-CM

## 2016-04-08 DIAGNOSIS — G8929 Other chronic pain: Secondary | ICD-10-CM

## 2016-04-08 NOTE — ED Triage Note (Signed)
BIBA from home. Ems reported several people at this house.  Pt reportedly had a syncopal episode when standing up. Pt was anssisted to couch. No head injury. Pt reportedly took four Norco 10/325 this evening. Pt wakes up to verbal commands. Pt sts that he took only 4 tabs.

## 2016-04-08 NOTE — ED Nursing Note (Signed)
Pt on cardiac monitor. Vitals are stable. Pt is within view of RN station. No distress noted.

## 2016-04-08 NOTE — ED Initial Note (Signed)
EMERGENCY DEPARTMENT PHYSICIAN NOTE - Bryan KickHoward Dale Zhang       Date of Service:   04/08/2016  1:14 AM Patient's PCP: No Pcp No Pcp   Note Started: 04/08/2016 01:26 DOB: 04/07/1951             Chief Complaint   Patient presents with    Syncope           The history provided by the patient.  Interpreter used: No    Bryan Martinez is a 65yr old male, with a past medical history significant for substance missuse, who presents to the ED with a chief complaint of AMS after he took four norco 10/325 tabs then became overly sedate. Pt reports he took it for an acute exacerbation of chronic sacral area back pain. Pt denies trauma or Loss of bowel or bladder control, no saddle anesthesia. Pt denies any other specific complaints. No chest pain, syncope, palpitations, headache, or confusion preceding pain med ingestions. Now pt feels very sleepy and is having a hard time staying awake.         A full history, including pertinent past medical and social history was reviewed.    HISTORY:  There are no active hospital problems to display for this patient.   No Known Allergies   No past medical history on file. No past surgical history on file.   Social History    Marital status: SINGLE              Spouse name:                       Years of education:                 Number of children:               Occupational History    None on file    Social History Main Topics    Smoking status: Not on file                          Smokeless status: Not on file                       Alcohol use: Not on file     Drug use: Not on file     Sexual activity: Not on file          Other Topics            Concern    None on file    Social History Narrative    None on file     No family history on file.             Review of Systems    TRIAGE VITAL SIGNS:  Temp: (!) 35.8 C (96.4 F) (04/08/16 0108)  Temp src: Temporal (04/08/16 0108)  Pulse: 87 (04/08/16 0108)  BP: 119/86 (04/08/16 0108)  Resp: 14 (04/08/16 0108)  SpO2: 94 % (04/08/16  0108)  Weight: 70.3 kg (155 lb) (04/08/16 0108)    Physical Exam      INITIAL ASSESSMENT & PLAN, MEDICAL DECISION MAKING, ED COURSE  Bryan KickHoward Dale Martinez is a 89yr male who presents with a chief complaint of AMS.     Differential includes, but is not limited to: see dictation            Chart Review: I reviewed the patient's prior medical  records.     Dictation number: [id number]        LAST VITAL SIGNS:  Temp: (!) 35.8 C (96.4 F) (04/08/16 0108)  Temp src: Temporal (04/08/16 0108)  Pulse: 87 (04/08/16 0108)  BP: 119/86 (04/08/16 0108)  Resp: 14 (04/08/16 0108)  SpO2: 94 % (04/08/16 0108)  Weight: 70.3 kg (155 lb) (04/08/16 0108)      Clinical Impression: overdose on opiates, altered mentation, sedation.            Disposition: Discharge. Follow up with pcp. ED discharge instructions were reviewed and provided.      PATIENT'S GENERAL CONDITION:  Good: Vital signs are stable and within normal limits. Patient is conscious and comfortable. Indicators are excellent.     Electronically signed by: Brain HiltsShane Luke Danijah Noh

## 2016-04-08 NOTE — ED Nursing Note (Signed)
Pt declined AVS instructions, could not wait to sign discharge.

## 2016-04-08 NOTE — ED Nursing Note (Addendum)
Pt falls asleep but is easily woken by voice. No distress noted. Awaiting ER MD orders.

## 2016-04-08 NOTE — Progress Notes (Signed)
EMERGENCY ROOM REPORT  PATIENT NAME: Bryan Martinez, Zyron DALE  DOB: Apr 07, 1951  DATE OF VISIT: 04/08/2016  AGE: 65          The patient is a 65 year old male who presents to the emergency department essentially for evaluation of altered mentation.  The patient was at home complaining of kind of an acute exacerbation of his chronic back pain without any significant trauma when   he decided to take a roommate's Norco.  He ended up taking 4 Norco 10/325s within a short period of time and then became somewhat sedate.  The patient denies any remarkable alcohol use associated with it, but has had some alcohol.  The patient denies   any particular complaints, but his friend was concerned that he was overly sedated.  The patient denies taking any other acetaminophen containing substances throughout the day or recently.  The patient denies any loss of bowel or bladder control.  No   saddle anesthesia.  No weakness of the extremities.  For further details, see the written note.   REVIEW OF SYSTEMS:    Otherwise negative except as mentioned above.    PHYSICAL EXAM:    GENERAL: Remarkable for a moderately sedate 65 year old male who awakens easily to stimulation.  He is in no acute distress.     HEENT: Moist mucous membranes. Normocephalic, atraumatic.  Pupils are pin point.  Extraocular motion is intact.     NECK:  Supple.  Full range of motion.  No signs of meningismus.     LUNGS:  Clear to auscultation bilaterally.  Good air flow.     HEART:  Regular rate.  No murmurs, rubs, or gallops.     ABDOMEN:  Soft, nontender, nondistended.     SKIN:  Warm and dry.     MUSCULOSKELETAL SYSTEM:  Grossly intact.     PSYCH:  Somewhat flat affect.     NEURO: Exam is significant for some degree of sedation.  Patient does require stimulation throughout the interview at a very mild level.  Simply verbal is adequate to get him to respond and answer questions.    MEDICAL DECISION MAKING:    This is a 65 year old male who does appear to be under  the influence of opiates.  He does not have any evidence of end-organ failure.  He is not hypoxic.  He is not having any apnea.  He is mentating well when aroused.  I do not feel he has any pending   airway obstruction.  I do not think this is polypharmacy, at least per the patient's report.  He seems pretty honest about what is going on.  He adamantly denies any suicidal ideation.  He has not been particularly depressed.  He does have chronic pain   issues that do not appear to be adequately addressed at this point in time.  No laboratory tests were done and at this point I do not think they were needed.  The patient was advised not to take medications not prescribed for him and especially not to   take any medications in excess of proper dosages.  The patient reports an understanding of these instructions as well as the desire to comply.  For further details, please see the written note.    CLINICAL IMPRESSION:    At the time of discharge from the emergency department, in stable condition with clear goal-directed speech, normal mentation, was:   1. Overdose on opiates.   2. Altered mentation.   3. Sedation.  Electronically Signed by Wesley BlasShane L. Tawni Carnesorgerson, MD on 04/11/2016 04:54:02  Deann Mclaine L. Tawni Carnesorgerson, MD               DI: SLT  DD: 04/08/2016 12:26:02           JOB: [id number]  DT: 04/08/2016 16:26:39  DICT ID [id number]   MT: DF           Nani Ingram L. Tawni Carnesorgerson, MD     PATIENT NAMMarland Kitchen: Bryan Martinez,                              Garth                              DALE                               [id number]                             CSN-VISIT ID: [id number]                             DATE: 04/08/2016                             PATIENT ROOM #: ED10                             EMERGENCY ROOM REPORT    Page 3 of 4

## 2016-04-08 NOTE — ED Nursing Note (Signed)
Patient discharged from ED with AVS, Rx, related instructions and all belongings. Patient is in NAD, is awake/alert skin, is pink/warm/dry. Pt leaves the ED ambulatory with friends.

## 2016-04-08 NOTE — ED Nursing Note (Signed)
Report given to Anna, RN 

## 2016-05-09 ENCOUNTER — Encounter (HOSPITAL_BASED_OUTPATIENT_CLINIC_OR_DEPARTMENT_OTHER): Payer: Self-pay | Admitting: Cardiovascular Disease

## 2016-05-09 ENCOUNTER — Other Ambulatory Visit (HOSPITAL_BASED_OUTPATIENT_CLINIC_OR_DEPARTMENT_OTHER): Payer: Self-pay | Admitting: Cardiovascular Disease

## 2016-05-09 DIAGNOSIS — I5022 Chronic systolic (congestive) heart failure: Secondary | ICD-10-CM | POA: Insufficient documentation

## 2016-05-09 DIAGNOSIS — Z7901 Long term (current) use of anticoagulants: Secondary | ICD-10-CM | POA: Insufficient documentation

## 2016-05-09 DIAGNOSIS — I255 Ischemic cardiomyopathy: Secondary | ICD-10-CM | POA: Insufficient documentation

## 2016-05-09 DIAGNOSIS — I739 Peripheral vascular disease, unspecified: Secondary | ICD-10-CM | POA: Insufficient documentation

## 2016-05-09 DIAGNOSIS — I2510 Atherosclerotic heart disease of native coronary artery without angina pectoris: Secondary | ICD-10-CM | POA: Insufficient documentation

## 2016-05-09 DIAGNOSIS — Z952 Presence of prosthetic heart valve: Secondary | ICD-10-CM | POA: Insufficient documentation

## 2016-05-09 DIAGNOSIS — I6529 Occlusion and stenosis of unspecified carotid artery: Secondary | ICD-10-CM | POA: Insufficient documentation

## 2016-05-09 DIAGNOSIS — J439 Emphysema, unspecified: Principal | ICD-10-CM

## 2016-05-09 DIAGNOSIS — Z95810 Presence of automatic (implantable) cardiac defibrillator: Secondary | ICD-10-CM | POA: Insufficient documentation

## 2016-05-09 DIAGNOSIS — Z951 Presence of aortocoronary bypass graft: Secondary | ICD-10-CM | POA: Insufficient documentation

## 2016-05-17 ENCOUNTER — Ambulatory Visit (HOSPITAL_BASED_OUTPATIENT_CLINIC_OR_DEPARTMENT_OTHER): Payer: Medicaid (Managed Care) | Admitting: Cardiovascular Disease

## 2016-06-25 ENCOUNTER — Emergency Department
Admission: AD | Admit: 2016-06-25 | Discharge: 2016-06-25 | Disposition: A | Discharge status: Home / Self Care | Payer: Medicaid (Managed Care) | Attending: Medical | Admitting: Medical

## 2016-06-25 ENCOUNTER — Emergency Department: Payer: Medicaid (Managed Care)

## 2016-06-25 DIAGNOSIS — R338 Other retention of urine: Secondary | ICD-10-CM

## 2016-06-25 DIAGNOSIS — K5904 Chronic idiopathic constipation: Secondary | ICD-10-CM | POA: Insufficient documentation

## 2016-06-25 DIAGNOSIS — N401 Enlarged prostate with lower urinary tract symptoms: Secondary | ICD-10-CM | POA: Insufficient documentation

## 2016-06-25 DIAGNOSIS — R109 Unspecified abdominal pain: Secondary | ICD-10-CM

## 2016-06-25 DIAGNOSIS — Z760 Encounter for issue of repeat prescription: Secondary | ICD-10-CM | POA: Insufficient documentation

## 2016-06-25 LAB — CBC WITH DIFFERENTIAL
BASOPHILS % AUTO: 0.6 % (ref 0.0–1.0)
BASOPHILS ABS AUTO: 0.1 10*3/uL (ref 0.0–0.1)
EOSINOPHIL % AUTO: 0.7 % (ref 0.0–4.0)
EOSINOPHIL ABS AUTO: 0.1 10*3/uL (ref 0.0–0.2)
HEMATOCRIT: 39.3 % (ref 38.0–51.0)
HEMOGLOBIN: 13.3 g/dL (ref 13.0–18.0)
IMMATURE GRANULOCYTES % AUTO: 0.4 % (ref 0.00–0.50)
IMMATURE GRANULOCYTES ABS AUTO: 0 10*3/uL (ref 0.0–0.0)
LYMPHOCYTE ABS AUTO: 1.6 10*3/uL (ref 1.3–2.9)
LYMPHOCYTES % AUTO: 15.7 % (ref 5.0–41.0)
MCH: 27.9 pg (ref 27.0–34.0)
MCHC G/DL: 33.8 g/dL (ref 33.0–37.0)
MCV: 82.4 fL (ref 82.0–99.0)
MONOCYTES % AUTO: 6.1 % (ref 0.0–10.0)
MONOCYTES ABS AUTO: 0.6 10*3/uL (ref 0.3–0.8)
MPV: 9.3 fL — AB (ref 9.4–12.4)
NEUTROPHIL ABS AUTO: 7.62 10*3/uL — AB (ref 2.20–4.80)
NEUTROPHILS % AUTO: 76.5 % — AB (ref 45.0–75.0)
NUCLEATED CELL COUNT: 0 10*3/uL (ref 0.0–0.1)
NUCLEATED RBC/100 WBC: 0 %{WBCs} (ref <=0.0)
PLATELET COUNT: 257 10*3/uL (ref 151–365)
RDW: 13.2 % (ref 11.5–14.5)
RED CELL COUNT: 4.77 10*6/uL (ref 4.10–5.65)
WHITE BLOOD CELL COUNT: 10 10*3/uL (ref 4.2–10.8)

## 2016-06-25 LAB — COMPREHENSIVE METABOLIC PANEL
ALANINE TRANSFERASE (ALT): 13 U/L (ref 4–56)
ALB/GLOB RATIO_MMC: 0.9 — AB (ref 1.0–1.6)
ALBUMIN: 3.6 g/dL (ref 3.2–4.7)
ALKALINE PHOSPHATASE (ALP): 84 U/L (ref 38–126)
ASPARTATE TRANSAMINASE (AST): 18 U/L (ref 9–44)
BILIRUBIN TOTAL: 0.8 mg/dL (ref 0.1–2.2)
BUN/CREATININE_MMC: 20.8 (ref 7.3–21.7)
CALCIUM: 9.6 mg/dL (ref 8.7–10.2)
CARBON DIOXIDE TOTAL: 22 mmol/L (ref 22–32)
CHLORIDE: 106 mmol/L (ref 99–109)
CREATININE BLOOD: 1.01 mg/dL (ref 0.50–1.30)
E-GFR, NON-AFRICAN AMERICAN: 74 mL/min/{1.73_m2}
E-GFR_MMC: 74 mL/min/{1.73_m2}
GLOBULIN BLOOD_MMC: 4 g/dL (ref 2.2–4.2)
GLUCOSE: 105 mg/dL — AB (ref 70–100)
POTASSIUM: 3.7 mmol/L (ref 3.5–5.2)
PROTEIN: 7.6 g/dL (ref 5.9–8.2)
SODIUM: 139 mmol/L (ref 134–143)
UREA NITROGEN, BLOOD (BUN): 21 mg/dL (ref 6–21)

## 2016-06-25 LAB — URINALYSIS-COMPLETE
BILIRUBIN URINE: NEGATIVE
GLUCOSE URINE: NEGATIVE mg/dL
KETONES_MMC: 5 — AB
LEUK. ESTERASE: NEGATIVE
NITRITE URINE: NEGATIVE
PH URINE: 5 (ref 5.0–7.0)
PROTEIN URINE: NEGATIVE mg/dL
RBC: 1 /HPF (ref <=3)
SPECIFIC GRAVITY URINE MMC: 1.016 (ref 1.005–1.030)
UROBILINOGEN.: NEGATIVE mg/dL
WBC: 1 /HPF (ref <3)

## 2016-06-25 LAB — ELECTROCARDIOGRAM WITH RHYTHM STRIP: QTC: 440 ms

## 2016-06-25 LAB — LIPASE: LIPASE: 14 U/L — AB (ref 22–51)

## 2016-06-25 LAB — TROPONIN I: TROPONIN I_MMC: 0.02 ng/mL (ref <=0.03)

## 2016-06-25 MED ORDER — TAMSULOSIN 0.4 MG CAPSULE: 0.4000 mg | ORAL_CAPSULE | Freq: Every day | ORAL | 0 refills | Status: DC

## 2016-06-25 MED ORDER — IBUPROFEN 600 MG TABLET
600.00 mg | ORAL_TABLET | Freq: Once | ORAL | Status: AC
Start: 2016-06-25 — End: 2016-06-25
  Administered 2016-06-25: 600 mg via ORAL
  Filled 2016-06-25: qty 1

## 2016-06-25 NOTE — ED Initial Note (Signed)
EMERGENCY DEPARTMENT PHYSICIAN NOTE - Bryan Martinez       Date of Service:   06/25/2016 11:35 AM Patient's PCP: No Pcp No Pcp (Inactive)   Note Started: 06/25/2016 17:10 DOB: 1951/02/13             Chief Complaint   Patient presents with    Abd Pain Active    Urinary Retention / unable to void     The history provided by the patient.  Interpreter used: No    Bryan Martinez is a 66yr old male, with a past medical history significant for CAD, HTN, BPH, who presents to the ED with a chief complaint of abdominal pain and difficulty urinating that began 3 days ago. Patient states that he has been out of Flomax for 2 days and thinks this is why he is having difficulty urinating. He states his abdominal pain is lower. He denies any fever, nausea, vomiting, diarrhea, dizziness or bloating.    Quality: sharp  Location: abdomen  Severity: 7/10  Time Course: constant  Progression: unchanged  Duration: 3 days  Palliative factors: nothing makes it better.  Provocative factors: nothing makes it worse.  Associated symptoms: noted above  Pertinent negatives: noted above    A full history, including pertinent past medical and social history was reviewed.    HISTORY:  There are no active hospital problems to display for this patient.   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  No date: PVD (peripheral vascular disease)  11/18/2013: S/P CABG (coronary artery bypass graft) Past Surgical History:  11/18/2013: Cabg unspec # vessels - hx only  11/03/2015: Hc bi-v icd device   Social History    Marital status: SINGLE              Spouse name:                       Years of education:                 Number of  children:               Occupational History    None on file    Social History Main Topics    Smoking status: Not on file                          Smokeless status: Not on file                       Alcohol use: Not on file     Drug use: Not on file     Sexual activity: Not on file          Other Topics            Concern    None on file    Social History Narrative    None on file     Review of patient's family history indicates:    Heart Disease                  Mother  Heart Disease                  Father                    rhematoid arthritis [OTHER]    Sister                    Cancer                         Brother                     Comment: prostate             Review of Systems   Constitutional: Negative for activity change, chills, diaphoresis, fatigue, fever and unexpected weight change.   HENT: Negative for congestion, ear discharge, ear pain, facial swelling, mouth sores, nosebleeds, postnasal drip, rhinorrhea, sinus pressure, sneezing and sore throat.    Eyes: Negative for pain, discharge, redness, itching and visual disturbance.   Respiratory: Negative for cough, choking, chest tightness, shortness of breath, wheezing and stridor.    Cardiovascular: Negative for chest pain, palpitations and leg swelling.   Gastrointestinal: Positive for abdominal pain. Negative for abdominal distention, blood in stool, constipation, diarrhea, nausea, rectal pain and vomiting.   Endocrine: Negative for polydipsia, polyphagia and polyuria.   Genitourinary: Positive for decreased urine volume. Negative for dysuria, enuresis, flank pain, frequency, hematuria and urgency.   Musculoskeletal: Negative for arthralgias, back pain, gait problem, myalgias and neck pain.   Skin: Negative for color change, pallor, rash and wound.   Allergic/Immunologic: Negative for environmental allergies, food allergies and immunocompromised state.   Neurological: Negative for dizziness, tremors, syncope, light-headedness,  numbness and headaches.   Hematological: Negative for adenopathy. Does not bruise/bleed easily.   Psychiatric/Behavioral: Negative for agitation, confusion, hallucinations, self-injury, sleep disturbance and suicidal ideas. The patient is not nervous/anxious.      TRIAGE VITAL SIGNS:  Temp: 36.2 C (97.1 F) (06/25/16 1139)  Temp src: Oral (06/25/16 1139)  Pulse: 104 (06/25/16 1139)  BP: (!) 149/96 (06/25/16 1139)  Resp: 16 (06/25/16 1139)  SpO2: 97 % (06/25/16 1139)  Weight: 68 kg (150 lb) (06/25/16 1139)    Physical Exam   Constitutional: He is oriented to person, place, and time. He appears well-developed and well-nourished.   HENT:   Head: Normocephalic and atraumatic.   Nose: Nose normal.   Mouth/Throat: Oropharynx is clear and moist.   Eyes: Conjunctivae are normal. Pupils are equal, round, and reactive to light.   Neck: Normal range of motion. Neck supple.   Cardiovascular: Normal rate, regular rhythm and normal heart sounds.    Pulmonary/Chest: Effort normal and breath sounds normal.   Abdominal: Soft. Bowel sounds are normal. He exhibits no distension and no mass. There is tenderness (Suprapubic only). There is no rebound and no guarding.   Musculoskeletal: Normal range of motion.   Neurological: He is alert and oriented to person, place, and time.   Skin: Skin is warm and dry.   Psychiatric: He has a normal mood and affect. His behavior is normal.     INITIAL ASSESSMENT & PLAN, MEDICAL DECISION MAKING, ED COURSE  Bryan Martinez is a [age]yr male who presents with a chief complaint of abdominal pain, and difficulty urinating. Patient had lab work done in ED which showed no acute findings. Abdominal x-ray shows constipation, no sbo, no free air. Patient is in [facility]  custody and they have cleared him of arrest. Patient shortly after left the hospital with an IV in. He was returned to the ED and I discussed labs with him. He will be discharge to follow with PCP, refill of his Flomax.     Differential  includes, but is not limited to: UTI, Pyelo, acute abdomen, constipation.     The results of the ED evaluation were notable for the following :    Pertinent lab results:   Results for orders placed or performed during the hospital encounter of 06/25/16   CBC WITH DIFFERENTIAL     Status: Abnormal   Result Value Status    White Blood Cell Count 10.0 Final    Red Blood Cell Count 4.77 Final    Hemoglobin 13.3 Final    Hematocrit 39.3 Final    MCV 82.4 Final    MCH 27.9 Final    MCHC 33.8 Final    RDW 13.2 Final    MPV 9.3 (L) Final    Platelet Count 257 Final    Neutrophils % Auto 76.5 (H) Final    Lymphocytes % Auto 15.7 Final    Monocytes % Auto 6.1 Final    Eosinophil % Auto 0.7 Final    Basophils % Auto 0.6 Final    Immature Granulocytes % 0.40 Final    Neutrophil Abs Auto 7.62 (H) Final    Lymphocyte Abs Auto 1.6 Final    Monocytes Abs Auto 0.6 Final    Eosinophil Abs Auto 0.1 Final    Basophils Abs Auto 0.1 Final    Nucleated RBC % Auto 0.0 Final    NRBC Abs Auto 0.0 Final    Immature Granulocytes Abs Auto 0.0 Final   COMPREHENSIVE METABOLIC PANEL     Status: Abnormal   Result Value Status    Sodium 139 Final    Potassium 3.7 Final    Chloride 106 Final    Carbon Dioxide Total 22 Final    Urea Nitrogen, Blood (BUN) 21 Final    Glucose 105 (H) Final    Calcium 9.6 Final    Protein 7.6 Final    Albumin 3.6 Final    Alkaline Phosphatase (ALP) 84 Final    Aspartate Transaminase (AST) 18 Final    Bilirubin Total 0.8 Final    Alanine Transferase (ALT) 13 Final    Creatinine Serum 1.01 Final    Globulin 4.0 Final    Alb/Glob Ratio 0.9 (L) Final    BUN/ Creatinine 20.8 Final    E-GFR 74 Final    E-GFR, Non-African American 74 Final   LIPASE     Status: Abnormal   Result Value Status    Lipase 14 (L) Final   URINALYSIS-COMPLETE     Status: Abnormal   Result Value Status    COLOR Yellow Final    CLARITY Clear Final    SPECIFIC GRAVITY URINE 1.016 Final    pH Urine 5.0 Final    Protein Urine Negative Final    Glucose Urine  Negative Final    Ketones 5  (Abnl) Final    BILIRUBIN URINE Negative Final    UROBILINOGEN. Negative Final    Occult Blood Urine Small (Abnl) Final    Leuk. Esterase Negative Final    Nitrite Urine Negative Final    WBC, URINE 1 Final    RBC 1 Final    Squamous Epithelial Cells <1 Final    Mucous/LPF Few Final   TROPONIN I  Status: Normal   Result Value Status    Troponin I 0.02 Final     Radiology reads: Dx Abdomen Complete (3+ Views)    Result Date: 06/25/2016  Technique: Supine and left lateral decubitus views of the abdomen. Comparison: None. Findings: Loops of small and large bowel are not dilated. The bowel gas pattern is nonobstructive. There is no evidence of free air. Probable fecal stasis is seen in the colon. Mild extra convex scoliosis and severe degenerative spondylosis of the lumbar spine are incidentally noted. Degenerative changes of the right hip with associated dystrophic ossifications or loose bodies are also incidentally noted. No other abnormal calcifications are seen.     Impression: No evidence of bowel obstruction or free intraperitoneal air. Probable fecal stasis/constipation.    Chart Review: I reviewed the patient's prior medical records. Pertinent information that is relevant to this encounter prior visits, labs, imaging if available.    Patient Summary: 66 year old male with abdominal pain. Work-up shows constipation. He has history of BPH and is out of Flomax - will refill. Patient to follow with PCP, return if worsening or concern.     LAST VITAL SIGNS:  Temp: 36.2 C (97.1 F) (06/25/16 1139)  Temp src: Oral (06/25/16 1139)  Pulse: 86 (06/25/16 1516)  BP: 135/88 (06/25/16 1516)  Resp: 16 (06/25/16 1516)  SpO2: 99 % (06/25/16 1516)  Weight: 68 kg (150 lb) (06/25/16 1139)    Clinical Impression:     ICD-10-CM    1. 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 Urinary retention due to benign prostatic hyperplasia N40.1     R33.8    3. Medication refill Z76.0      Disposition:  Discharge. Follow up with PCP. ED discharge instructions were reviewed and provided.    PATIENT'S GENERAL CONDITION:  Good: Vital signs are stable and within normal limits. Patient is conscious and comfortable. Indicators are excellent.     Electronically signed by: Wilhemina Bonito

## 2016-06-25 NOTE — ED Nursing Note (Signed)
Pt has been released from custody. Pt assisted to restroom.

## 2016-06-25 NOTE — ED Nursing Note (Signed)
Pt's IV removed and pt left without AVS.

## 2016-06-25 NOTE — ED Triage Note (Signed)
Brought in from jail because of complaints of lower abd pain and urinary hesitancy x 1 week.  Pt states he usually takes flow max for similar and has been out x 2 days.  Pt also states mid chest pain x 3 days.

## 2016-06-25 NOTE — Discharge Instructions (Signed)
Thank you for choosing [facility] for your emergency health care needs. It has been our privilege to take care of you today. You have been seen for abdominal pain and urinary problems and discharged home.     Please take all medicines that are prescribed to you as directed. It is crucial, if you have a primary care physician, to follow up with him or her in the time frame recommended as many health conditions that seem self-limited initially may actually worsen over time. If you do not have a primary care physician, we will outline the various resources available for you to find one.     If at any time you feel that your condition is worsening, call your doctor or return to the emergency department for reevaluation.     Please realize that the results of some studies that you had done during your stay with us (such as xrays and cultures) are only preliminarily resulted. Results of these studies may change as more information becomes available or as the studies are reevaluated by other members of our health care team in the next few days. We will attempt to contact you with any important changes or additions to the studies that were obtained today.     MEDICATIONS   1. Resume all home medications as directed.   2. Start new discharge medications as directed.   3. Do not drive or operate machinery while taking narcotic pain medications.   4. You may have been prescribed a pain medicine(s) that contains Tylenol (acetaminophen). If you are taking other Tylenol containing medicines at home, be sure NOT to exceed 4 grams (4000 milligrams) of Tylenol per day.

## 2016-06-25 NOTE — ED Nursing Note (Addendum)
Pt eloped with the IV in his leg. [facility] brought pt back at 1700

## 2016-06-25 NOTE — ED Nursing Note (Signed)
Bladder Scan Showed . Pt said he could urinate.

## 2016-06-28 ENCOUNTER — Emergency Department
Admission: AD | Admit: 2016-06-28 | Discharge: 2016-06-29 | Disposition: A | Discharge status: Home / Self Care | Payer: Medicaid (Managed Care) | Attending: Emergency Medicine | Admitting: Emergency Medicine

## 2016-06-28 DIAGNOSIS — Z951 Presence of aortocoronary bypass graft: Secondary | ICD-10-CM

## 2016-06-28 DIAGNOSIS — I2510 Atherosclerotic heart disease of native coronary artery without angina pectoris: Secondary | ICD-10-CM

## 2016-06-28 DIAGNOSIS — K8020 Calculus of gallbladder without cholecystitis without obstruction: Secondary | ICD-10-CM | POA: Insufficient documentation

## 2016-06-28 DIAGNOSIS — R109 Unspecified abdominal pain: Secondary | ICD-10-CM

## 2016-06-28 DIAGNOSIS — R1084 Generalized abdominal pain: Secondary | ICD-10-CM | POA: Insufficient documentation

## 2016-06-28 DIAGNOSIS — I739 Peripheral vascular disease, unspecified: Secondary | ICD-10-CM

## 2016-06-28 DIAGNOSIS — Z720 Tobacco use: Secondary | ICD-10-CM

## 2016-06-28 DIAGNOSIS — I6529 Occlusion and stenosis of unspecified carotid artery: Secondary | ICD-10-CM

## 2016-06-28 LAB — HEPATIC FUNCTION PANEL
ALANINE TRANSFERASE (ALT): 27 U/L (ref 4–56)
ALBUMIN: 3.5 g/dL (ref 3.2–4.7)
ALKALINE PHOSPHATASE (ALP): 86 U/L (ref 38–126)
ASPARTATE TRANSAMINASE (AST): 27 U/L (ref 9–44)
BILIRUBIN DIRECT: 0.1 mg/dL (ref <=0.5)
BILIRUBIN TOTAL: 0.4 mg/dL (ref 0.1–2.2)
PROTEIN: 7.2 g/dL (ref 5.9–8.2)

## 2016-06-28 LAB — CBC WITH DIFFERENTIAL
BASOPHILS % AUTO: 0.5 % (ref 0.0–1.0)
BASOPHILS ABS AUTO: 0 10*3/uL (ref 0.0–0.1)
EOSINOPHIL % AUTO: 1.7 % (ref 0.0–4.0)
EOSINOPHIL ABS AUTO: 0.1 10*3/uL (ref 0.0–0.2)
HEMATOCRIT: 36.8 % — AB (ref 38.0–51.0)
HEMOGLOBIN: 12.5 g/dL — AB (ref 13.0–18.0)
IMMATURE GRANULOCYTES % AUTO: 0.8 % — AB (ref 0.00–0.50)
IMMATURE GRANULOCYTES ABS AUTO: 0.1 10*3/uL — AB (ref 0.0–0.0)
LYMPHOCYTE ABS AUTO: 0.8 10*3/uL — AB (ref 1.3–2.9)
LYMPHOCYTES % AUTO: 13.1 % (ref 5.0–41.0)
MCH: 28.2 pg (ref 27.0–34.0)
MCHC G/DL: 34 g/dL (ref 33.0–37.0)
MCV: 83.1 fL (ref 82.0–99.0)
MONOCYTES % AUTO: 6.6 % (ref 0.0–10.0)
MONOCYTES ABS AUTO: 0.4 10*3/uL (ref 0.3–0.8)
MPV: 10.3 fL (ref 9.4–12.4)
NEUTROPHIL ABS AUTO: 4.91 10*3/uL — AB (ref 2.20–4.80)
NEUTROPHILS % AUTO: 77.3 % — AB (ref 45.0–75.0)
NUCLEATED CELL COUNT: 0 10*3/uL (ref 0.0–0.1)
NUCLEATED RBC/100 WBC: 0 %{WBCs} (ref <=0.0)
PLATELET COUNT: 153 10*3/uL (ref 151–365)
RDW: 13.3 % (ref 11.5–14.5)
RED CELL COUNT: 4.43 10*6/uL (ref 4.10–5.65)
WHITE BLOOD CELL COUNT: 6.4 10*3/uL (ref 4.2–10.8)

## 2016-06-28 LAB — BASIC METABOLIC PANEL
BUN/CREATININE_MMC: 27.5 — AB (ref 7.3–21.7)
CALCIUM: 9.1 mg/dL (ref 8.7–10.2)
CARBON DIOXIDE TOTAL: 27 mmol/L (ref 22–32)
CHLORIDE: 102 mmol/L (ref 99–109)
CREATININE BLOOD: 1.02 mg/dL (ref 0.50–1.30)
E-GFR, NON-AFRICAN AMERICAN: 74 mL/min/{1.73_m2}
E-GFR_MMC: 74 mL/min/{1.73_m2}
GLUCOSE: 146 mg/dL — AB (ref 70–100)
POTASSIUM: 3.9 mmol/L (ref 3.5–5.2)
SODIUM: 138 mmol/L (ref 134–143)
UREA NITROGEN, BLOOD (BUN): 28 mg/dL — AB (ref 6–21)

## 2016-06-28 LAB — TROPONIN I: TROPONIN I_MMC: 0.02 ng/mL (ref <=0.03)

## 2016-06-28 LAB — LIPASE: LIPASE: 16 U/L — AB (ref 22–51)

## 2016-06-28 NOTE — ED Triage Note (Signed)
Pt states RUQ pain this night after eating dinner, pt also states spitting up blood, severe pain in triage,

## 2016-06-28 NOTE — ED Nursing Note (Signed)
Patient was here a few days ago and was told that he was constipated. Patient still has not had a regular BM in 6 days

## 2016-06-28 NOTE — ED Nursing Note (Signed)
Patient witnessed sticking his fingers down his throat to facilitate vomiting. Asked patient to stop doing this and teaching done as to the dangers of doing such

## 2016-06-29 ENCOUNTER — Emergency Department: Payer: Medicaid (Managed Care)

## 2016-06-29 DIAGNOSIS — R1084 Generalized abdominal pain: Secondary | ICD-10-CM | POA: Insufficient documentation

## 2016-06-29 DIAGNOSIS — K8020 Calculus of gallbladder without cholecystitis without obstruction: Secondary | ICD-10-CM | POA: Insufficient documentation

## 2016-06-29 LAB — URINALYSIS-COMPLETE
BILIRUBIN URINE: NEGATIVE
GLUCOSE URINE: NEGATIVE mg/dL
KETONES_MMC: NEGATIVE
LEUK. ESTERASE: NEGATIVE
NITRITE URINE: NEGATIVE
PH URINE: 5 (ref 5.0–7.0)
PROTEIN URINE: NEGATIVE mg/dL
RBC: 4 /HPF — AB (ref <=3)
SPECIFIC GRAVITY URINE MMC: 1.02 (ref 1.005–1.030)
UROBILINOGEN.: NEGATIVE mg/dL
WBC: 1 /HPF (ref <3)

## 2016-06-29 LAB — ELECTROCARDIOGRAM WITH RHYTHM STRIP: QTC: 441 ms

## 2016-06-29 MED ORDER — ALUMINUM-MAG HYDROXIDE-SIMETHICONE 200 MG-200 MG-20 MG/5 ML ORAL SUSP
30.00 mL | Freq: Once | ORAL | Status: AC
Start: 2016-06-29 — End: 2016-06-29
  Administered 2016-06-29: 30 mL via ORAL
  Filled 2016-06-29: qty 30

## 2016-06-29 MED ORDER — SUCRALFATE 1 GRAM TABLET
1.00 g | ORAL_TABLET | Freq: Three times a day (TID) | ORAL | 0 refills | Status: DC
Start: 2016-06-29 — End: 2016-11-21

## 2016-06-29 MED ORDER — LIDOCAINE HCL 2 % MUCOSAL SOLUTION
10.00 mL | Freq: Once | Status: AC
Start: 2016-06-29 — End: 2016-06-29
  Administered 2016-06-29: 10 mL via ORAL
  Filled 2016-06-29: qty 15

## 2016-06-29 MED ORDER — IOPAMIDOL 370 MG IODINE/ML (76 %) INTRAVENOUS SOLUTION
90.00 mL | INTRAVENOUS | Status: AC
Start: 2016-06-29 — End: 2016-06-29
  Administered 2016-06-29: 90 mL via INTRAVENOUS

## 2016-06-29 MED ORDER — MORPHINE 2 MG/ML INTRAVENOUS CARTRIDGE
2.00 mg | CARTRIDGE | INTRAVENOUS | Status: DC | PRN
Start: 2016-06-29 — End: 2016-06-29
  Administered 2016-06-29 (×2): 4 mg via INTRAVENOUS
  Filled 2016-06-29 (×2): qty 2

## 2016-06-29 MED ORDER — OMEPRAZOLE 20 MG CAPSULE,DELAYED RELEASE: 20.0000 mg | DELAYED_RELEASE_CAPSULE | Freq: Every day | ORAL | 0 refills | Status: DC

## 2016-06-29 MED ORDER — ONDANSETRON HCL (PF) 4 MG/2 ML INJECTION SOLUTION
4.00 mg | Freq: Once | INTRAMUSCULAR | Status: AC
Start: 2016-06-29 — End: 2016-06-29
  Administered 2016-06-29: 4 mg via INTRAVENOUS
  Filled 2016-06-29: qty 2

## 2016-06-29 NOTE — ED Nursing Note (Signed)
Pt resting in no distress, continues to report 4/10 right abd/flank pain post med intervention.  Request additional pain medication intervention.  MD notified/aware.  Awaiting CT results.

## 2016-06-29 NOTE — ED Nursing Note (Signed)
Patient notified by Dr. Heath GoldJagdeo that he has gallstones. Dr. Heath GoldJagdeo asked that patient get a glass of milk. Patient given a glass of non-fat milk.

## 2016-06-29 NOTE — ED Nursing Note (Signed)
Patient discharged from ED with AVS, Rx, related instructions and all belongings. Patient is in NAD, is awake/alert skin, is pink/warm/dry. Pt leaves the ED ambulatory with friends.

## 2016-06-29 NOTE — ED Initial Note (Signed)
EMERGENCY DEPARTMENT PHYSICIAN NOTE - Sigismund Cross       Date of Service:   06/28/2016  9:41 PM Patient's PCP: No Pcp No Pcp (Inactive)   Note Started: 06/29/2016 00:18 DOB: April 10, 1951             Chief Complaint   Patient presents with    Abdominal Pain           The history provided by the patient.  Interpreter used: No    Bryan Martinez is a 66yrold male, with a past medical history significant for sCHF s/p AICD, and CAD, who presents to the ED with a chief complaint of abdominal pain that began/ today. RUQ, tightness, non-radiating, intermittent, 8/10, worse with drinking, no relieving factors.  +nausea.      No fever/chills, shortness of breath, no hematuria, no bloody stools    +chest pain during abdominal pain episodes.      +NSAIDS 600 mg motrin 3-4x/day    BM today; wnl    A full history, including pertinent past medical and social history was reviewed and updated as necessary..Marland Kitchen   HISTORY:  There are no active hospital problems to display for this patient.   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  No date: PVD (peripheral vascular disease)  11/18/2013: S/P CABG (coronary artery bypass graft) Past Surgical History:  11/18/2013: Cabg unspec # vessels - hx only  11/03/2015: Hc bi-v icd device   Social History    Marital status: SINGLE              Spouse name:                       Years of education:                 Number of children:               Occupational History    None on file    Social History Main Topics    Smoking status: Light Tobacco Smoker                                                         Packs/day: 0.25      Years: 0.00           Types: Cigarettes     Smokeless status: Never Used                        Alcohol use: No              Drug use: Not on file        Comment: cbd oil is used for pain    Sexual activity: Not on file          Other Topics            Concern    None on file    Social History Narrative    None on file  Review of patient's family history indicates:    Heart Disease                  Mother                    Heart Disease                  Father                    rhematoid arthritis [OTHER]    Sister                    Cancer                         Brother                     Comment: prostate             Review of Systems   Constitutional: Negative for chills and fever.   HENT: Negative for congestion, rhinorrhea and sore throat.    Eyes: Negative for discharge and visual disturbance.   Respiratory: Negative for cough and shortness of breath.    Cardiovascular: Positive for chest pain. Negative for leg swelling.   Gastrointestinal: Positive for abdominal pain and nausea. Negative for diarrhea and vomiting.   Genitourinary: Negative for dysuria, flank pain, hematuria and testicular pain.   Musculoskeletal: Negative for joint swelling and myalgias.   Skin: Negative for pallor and rash.   Neurological: Negative for seizures and headaches.   Psychiatric/Behavioral: Negative for agitation and behavioral problems.   All other systems reviewed and are negative.      TRIAGE VITAL SIGNS:  Temp: 36.6 C (97.8 F) (06/28/16 2139)  Temp src: (not recorded)  Pulse: 105 (06/28/16 2139)  BP: 123/81 (06/28/16 2139)  Resp: 16 (06/28/16 2139)  SpO2: 99 % (06/28/16 2139)  Weight: 61.2 kg (135 lb) (06/28/16 2139)    Physical Exam   Constitutional: He is oriented to person, place, and time. He appears well-developed and well-nourished. No distress.   HENT:   Head: Normocephalic and atraumatic.   Mouth/Throat: Oropharynx is clear and moist.   Eyes: Conjunctivae and EOM are normal. Pupils are equal, round, and reactive to light.   Neck: Normal range of motion.  Neck supple.   Cardiovascular: Normal rate, regular rhythm, normal heart sounds and intact distal pulses.    No murmur heard.  Pulmonary/Chest: Effort normal and breath sounds normal. No respiratory distress.   Abdominal: Soft. Bowel sounds are normal. He exhibits no distension. There is tenderness (RUQ and RLQ). There is no rebound and no guarding.   Musculoskeletal: Normal range of motion. He exhibits no edema.   Neurological: He is alert and oriented to person, place, and time. No cranial nerve deficit.   Skin: Skin is warm and dry.   Psychiatric: He has a normal mood and affect. His behavior is normal.   Nursing note and vitals reviewed.        INITIAL ASSESSMENT & PLAN, MEDICAL DECISION MAKING, ED COURSE  Bryan Martinez is a 63yrmale who presents with a chief complaint of abdominal pain.     Differential includes, but is not limited to: acute cholecystitis, gastritis, pancreatitis, PUD      The results of the ED evaluation were notable for the following:    Pertinent lab results:   Labs Reviewed  CBC WITH DIFFERENTIAL - Abnormal; Notable for the following:        Result Value    Hemoglobin 12.5 (*)     Hematocrit 36.8 (*)     Neutrophils % Auto 77.3 (*)     Immature Granulocytes % 0.80 (*)     Neutrophil Abs Auto 4.91 (*)     Lymphocyte Abs Auto 0.8 (*)     Immature Granulocytes Abs Auto 0.1 (*)     All other components within normal limits   BASIC METABOLIC PANEL - Abnormal; Notable for the following:     Glucose 146 (*)     Urea Nitrogen, Blood (BUN) 28 (*)     BUN/ Creatinine 27.5 (*)     All other components within normal limits    Narrative:     eGFR results >60 is indicative of normal kidney function.    **Stage 3 CKD   GFR 30-59  **Stage 4 CKD   GFR 15-29  **Stage 5 CKD   GFR <15**    The eGFR is an estimate of a patient's kidney function; it is most useful when it is in a relatively steady state and can be used to stage Chronic Kidney Disease. It may not be appropriate to adjust medication  dosing. Consult your Pharmacist. The eGFR may not be accurate for patients with extremes in body mass, muscle mass or nutritional status.    eGFR calculation is based on the (MDRD) equation validated only in Caucasians and African-Americans 75-25 years of age. If patient's race is not indicated and patient is African-American multiply the result by 1.21.    The eGfR may not be accurate for patients with extremes in body mass,muscle mass or nutritional status. The calculation may not be appropriate for medication adjustments in patients with renal impairment (consult Pharmacist).   LIPASE - Abnormal; Notable for the following:     Lipase 16 (*)     All other components within normal limits   URINALYSIS-COMPLETE - Abnormal; Notable for the following:     Occult Blood Urine Moderate (*)     RBC 4 (*)     All other components within normal limits   HEPATIC FUNCTION PANEL - Normal   TROPONIN I - Normal    Narrative:     A Troponin I level of <0.50 ng/mL is not suggestive of myocardial infarction. Comparison with serial Troponin I, CPK, and CKMB results is recommended. Clinical correlation is required.       Pertinent imaging results (reviewed and interpreted independently by me):     Radiology reads: CT A/P:   No evidence of appendicitis. No hydronephrosis. Severe degenerative changes with vertebral endplate erosion at Q0-3.  Mucosal thickening of distal esophagus. Gallbladder is contracted with gallstones. There is atherosclerosis of the abdominal aorta and bilateral iliac arteries. A moderate amount stool is present in the colon.     EKG (reviewed and interpreted independently by me): The rhythm is paced, rate 97, axis nl, no ST/T changes, unchanged from previous tracings.    Medications   Morphine Injection 2-8 mg (4 mg IV Given 06/29/16 0229)   Ondansetron (ZOFRAN) Injection 4 mg (4 mg IV Given 06/29/16 0045)   Alum-Mag Hydroxide-Simeth (MAALOX) 200-200-20 mg/5 mL Suspension 30 mL (30 mL ORAL Given 06/29/16 0050)    Lidocaine (XYLOCAINE) 2 % Viscous Solution 10 mL (10 mL ORAL Given 06/29/16 0050)   Iopamidol (ISOVUE-370) Injection 90 mL (90 mL IV Given 06/29/16 0138)  Chart Review: I reviewed the patient's prior medical records. Pertinent information that is relevant to this encounter   - ED visit 1/22 Dx with constipation..      Patient Summary: Bryan Martinez is a 66yrold male c/o RUQ pain; arrives in significant amount of pain.  ECG without ischemic changes.  Labs unremarkable; stable compared to 3 days ago.  No evidence of an acute abdomen on exam.  Imaging without acute findings; although multiple gallstones noted, distal esophagus thickening and moderate stool burden noted.  Pain may be multifactorial; will start omeprazole for management of likely GERD.  No evidence of acute cholecystitis; although recommended bland, low fat diet to help manage such.  Advised to avoid NSAIDs as they are irritating to the stomach lining.  Advised to follow up with pCP for re-check; patient amenable with this plan and precautions.          LAST VITAL SIGNS:  Temp: 36.6 C (97.8 F) (06/28/16 2139)  Temp src: (not recorded)  Pulse: 88 (06/29/16 0300)  BP: 142/87 (06/29/16 0300)  Resp: 18 (06/29/16 0300)  SpO2: 96 % (06/29/16 0300)  Weight: 61.2 kg (135 lb) (06/28/16 2139)      Clinical Impression:     ICD-10-CM    1. Generalized abdominal pain R10.84    2. Gallstones K80.20          Disposition: Discharge. Follow up with PCP. ED discharge instructions were reviewed and provided.      PATIENT'S GENERAL CONDITION:  Good: Vital signs are stable and within normal limits. Patient is conscious and comfortable. Indicators are excellent.     Electronically signed by: RTrixie Dredge MD

## 2016-06-29 NOTE — ED Nursing Note (Signed)
Received report from Keith RN to provide break coverage.

## 2016-08-31 ENCOUNTER — Telehealth (HOSPITAL_BASED_OUTPATIENT_CLINIC_OR_DEPARTMENT_OTHER): Payer: Self-pay | Admitting: Cardiovascular Disease
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
## 2016-09-04 ENCOUNTER — Emergency Department
Admission: EM | Admit: 2016-09-04 | Discharge: 2016-09-04 | Disposition: A | Discharge status: Home / Self Care | Payer: Medicaid (Managed Care) | Attending: Emergency Medicine | Admitting: Emergency Medicine

## 2016-09-04 DIAGNOSIS — I2510 Atherosclerotic heart disease of native coronary artery without angina pectoris: Secondary | ICD-10-CM

## 2016-09-04 DIAGNOSIS — S41152A Open bite of left upper arm, initial encounter: Secondary | ICD-10-CM

## 2016-09-04 DIAGNOSIS — S51851A Open bite of right forearm, initial encounter: Secondary | ICD-10-CM

## 2016-09-04 DIAGNOSIS — J449 Chronic obstructive pulmonary disease, unspecified: Secondary | ICD-10-CM

## 2016-09-04 DIAGNOSIS — W540XXA Bitten by dog, initial encounter: Secondary | ICD-10-CM | POA: Insufficient documentation

## 2016-09-04 DIAGNOSIS — I6529 Occlusion and stenosis of unspecified carotid artery: Secondary | ICD-10-CM

## 2016-09-04 DIAGNOSIS — S0185XA Open bite of other part of head, initial encounter: Secondary | ICD-10-CM | POA: Insufficient documentation

## 2016-09-04 DIAGNOSIS — Z720 Tobacco use: Secondary | ICD-10-CM

## 2016-09-04 DIAGNOSIS — Z950 Presence of cardiac pacemaker: Secondary | ICD-10-CM

## 2016-09-04 DIAGNOSIS — I739 Peripheral vascular disease, unspecified: Secondary | ICD-10-CM

## 2016-09-04 DIAGNOSIS — Z951 Presence of aortocoronary bypass graft: Secondary | ICD-10-CM

## 2016-09-04 MED ORDER — MORPHINE 4 MG/ML INTRAVENOUS CARTRIDGE
4.00 mg | CARTRIDGE | Freq: Once | INTRAVENOUS | Status: AC
Start: 2016-09-04 — End: 2016-09-04
  Administered 2016-09-04: 4 mg via INTRAVENOUS
  Filled 2016-09-04: qty 1

## 2016-09-04 MED ORDER — LIDOCAINE HCL 2 % MUCOSAL JELLY
Freq: Once | Status: AC
Start: 2016-09-04 — End: 2016-09-04
  Administered 2016-09-04: 13:00:00 via TOPICAL
  Filled 2016-09-04: qty 30

## 2016-09-04 MED ORDER — AMOXICILLIN 875 MG-POTASSIUM CLAVULANATE 125 MG TABLET
1.00 | ORAL_TABLET | Freq: Three times a day (TID) | ORAL | 0 refills | Status: DC
Start: 2016-09-04 — End: 2016-09-13

## 2016-09-04 MED ORDER — LORAZEPAM 2 MG/ML INJECTION SOLUTION
1.00 mg | Freq: Once | INTRAMUSCULAR | Status: AC
Start: 2016-09-04 — End: 2016-09-04
  Administered 2016-09-04: 1 mg via INTRAVENOUS
  Filled 2016-09-04: qty 1

## 2016-09-04 MED ORDER — NACL 0.9% IV BOLUS - DURATION REQ
1000.00 mL | Freq: Once | INTRAVENOUS | Status: AC
Start: 2016-09-04 — End: 2016-09-04
  Administered 2016-09-04: 1000 mL via INTRAVENOUS

## 2016-09-04 MED ORDER — AMPICILLIN-SULBACTAM 3 GRAM SOLUTION FOR INJECTION
3.00 g | Freq: Once | INTRAMUSCULAR | Status: AC
Start: 2016-09-04 — End: 2016-09-04
  Administered 2016-09-04: 3 g via INTRAVENOUS
  Filled 2016-09-04: qty 3000

## 2016-09-04 MED ORDER — TRANEXAMIC ACID 1,000 MG/10 ML (100 MG/ML) INTRAVENOUS SOLUTION
1000.00 mg | Freq: Once | INTRAVENOUS | Status: DC
Start: 2016-09-04 — End: 2016-09-04
  Filled 2016-09-04: qty 10

## 2016-09-04 NOTE — Discharge Instructions (Signed)
Take antibiotics as prescribed.    Drainage of fluids from the wound is good because your body is fighting the infection by pushing it out. Even some blood is okay, continued bleeding is not okay. Change bandages whenever wet or at least twice daily.    Keep wound clean, dry for first 12 hours, and covered if it may come into contact with anything dirty. Recommend warm compresses for 20 minutes at a time at least 3 times per day.     Elevate your arm arm to reduce swelling and bleeding.    Recommend Motrin and ice for pain relief.    Return here to have wound checked in 2-3 days here or with your Primary Care Provider.
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
## 2016-09-04 NOTE — Telephone Encounter (Signed)
Scheduled for 09/12/16

## 2016-09-04 NOTE — ED Nursing Note (Addendum)
Bryan Martinez, [place] to bedside for suturing.

## 2016-09-04 NOTE — ED Nursing Note (Signed)
RUE dressed with non-adherant and gauze dressing with Coban per order. Bacitracin applied to UE wounds and chin laceration. Pt tolerated well.

## 2016-09-04 NOTE — ED Nursing Note (Signed)
Pt c/o ongoing pain in RUE. In obvious distress (moaning, calling out in pain). Dressing soaking through with blood. Removed dressing and new non-adherent and kerlex dressing applied.

## 2016-09-04 NOTE — ED Nursing Note (Signed)
Pt dressing changed for third time. Significant blood, dressing saturated and blood pooling on dressing under arm. More lido applied and wound redressed. Dicussed with PA and ERP status of pt and asked for pt to be seen more urgently.
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
## 2016-09-04 NOTE — ED Nursing Note (Signed)
Pt waiting to be seen will cont to monitor.
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
## 2016-09-04 NOTE — ED Progress Note (Signed)
Jayden Rudge MD supervisory note: I have seen and evaluated the patient in conjunction with the APP provider. I have conducted an independent history and physical myself and agree with the midlevel findings. Additional findings are as follows:  See dictation [phone]

## 2016-09-04 NOTE — ED Nursing Note (Signed)
Pt asking for pain shot, awaiting to be seen ERP. Pt irritated. EDSO at bedside.

## 2016-09-04 NOTE — ED Nursing Note (Signed)
Chad Norton, PA at bedside for suturing.

## 2016-09-04 NOTE — ED Nursing Note (Signed)
Lido applied to R arm and chin. New dressing applied, given new blanket, and urinal.

## 2016-09-04 NOTE — ED Nursing Note (Signed)
Discussed with ERP for possible xray of pt arm, ERP declined at this time.

## 2016-09-05 ENCOUNTER — Encounter (HOSPITAL_BASED_OUTPATIENT_CLINIC_OR_DEPARTMENT_OTHER): Payer: Self-pay | Admitting: Cardiovascular Disease
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
## 2016-09-05 NOTE — ED Initial Note (Signed)
EMERGENCY DEPARTMENT PHYSICIAN NOTE - [facility]       Date of Service:   09/04/2016 12:02 PM Patient's PCP: No Pcp No Pcp (Inactive)   Note Started: 09/05/2016 08:48 DOB: 1951-01-31             Chief Complaint   Patient presents with    Animal Bite           The history provided by the patient and police.  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 Pt BIB [facility] following resisting arrest by Emergency planning/management officer and assault by police dog. Sheriff says dog had to be tazed to release pt. Pt is reluctant historian, appears in significant pain and anxious. Pt unable to tolerate passive range-of-motion of right hand secondary to pain, unsure if hand numb or not.    Quality: sharp  Location: whole right arm, wrist, hand  Severity: 10/10  Time Course: constant  Progression: unchanged  Duration: several hours  Palliative factors: nothing makes it better.  Provocative factors: bending, twisting, coughing, walking and standing makes it worse.  Associated symptoms: pain, bleeding  Pertinent negatives: pulsative bleeding    A full history, including pertinent past medical and social history was reviewed and updated as necessary.    HISTORY:  1    *Dog bite of multiple sites of left upper arm, initial encounter      Dog bite of face, initial encounter     No Known Allergies   Past Medical History:  No date: Carotid stenosis  No date: Chronic  anticoagulation  No date: Chronic systolic heart failure  No date: COPD (chronic obstructive pulmonary disease)  No date: Coronary artery disease  11/03/2015: ICD (implantable cardioverter-defibrillator) i*  No date: Ischemic cardiomyopathy  No date: Mitral valve replaced  No date: PVD (peripheral vascular disease)  11/18/2013: S/P CABG (coronary artery bypass graft) Past Surgical History:  11/18/2013: Cabg unspec # vessels - hx only  11/03/2015: Hc bi-v icd device   Social History    Marital status: SINGLE              Spouse name:                       Years of education:                 Number of children:               Occupational History    None on file    Social History Main Topics    Smoking status: Light Tobacco Smoker  Packs/day: 0.25      Years: 0.00           Types: Cigarettes    Smokeless status: Never Used                        Alcohol use: No              Drug use: Not on file        Comment: cbd oil is used for pain    Sexual activity: Not on file          Other Topics            Concern    None on file    Social History Narrative    None on file     Review of patient's family history indicates:    Heart Disease                  Mother                    Heart Disease                  Father                    rhematoid arthritis [OTHER]    Sister                    Cancer                         Brother                     Comment: prostate             Review of Systems   Constitutional: Negative for chills, diaphoresis and fever.   HENT: Negative for congestion, postnasal drip, rhinorrhea, sinus pressure and sore throat.    Eyes: Negative.    Respiratory: Negative for cough, shortness of breath and wheezing.    Cardiovascular: Negative for chest pain, palpitations and leg swelling.   Gastrointestinal: Negative for abdominal pain, diarrhea, nausea and vomiting.   Genitourinary: Negative.    Musculoskeletal: Negative for arthralgias, myalgias,  neck pain and neck stiffness.   Skin: Positive for color change and wound. Negative for pallor and rash.   Neurological: Negative for dizziness, syncope, weakness, light-headedness, numbness and headaches.   Psychiatric/Behavioral: Negative.        TRIAGE VITAL SIGNS:  Temp: (!) 35.6 C (96 F) (09/04/16 1211)  Temp src: Oral (09/04/16 1211)  Pulse: 100 (09/04/16 1211)  BP: 103/67 (09/04/16 1211)  Resp: 16 (09/04/16 1211)  SpO2: 95 % (09/04/16 1211)  Weight: 63.5 kg (140 lb) (09/04/16 1211)    Physical Exam   Constitutional: He is oriented to person, place, and time. He appears well-developed and well-nourished. No distress.   HENT:   Head: Normocephalic and atraumatic.   Right Ear: External ear normal.   Left Ear: External ear normal.   Nose: Nose normal.   Eyes: Conjunctivae are normal. Pupils are equal, round, and reactive to light.   Neck: Normal range of motion. Neck supple.   Cardiovascular: Normal rate, regular rhythm, normal heart sounds and intact distal pulses.    Pulmonary/Chest: Effort normal. No respiratory distress. He has wheezes. He has no rales. He exhibits no tenderness.   Abdominal: Soft. Bowel sounds are normal. He exhibits no distension  and no mass. There is no tenderness. There is no rebound and no guarding.   Musculoskeletal: He exhibits edema and tenderness. He exhibits no deformity.   Neurological: He is alert and oriented to person, place, and time.   Skin: Skin is warm. Laceration noted. No rash noted. He is not diaphoretic. There is erythema. No pallor.        over 30 puncture wounds to subcutaneous volar and dorsal tricep/bicep, forearm, hand. Largest 3x3 antecubital fossa, active bleeding at posterior tricep 3mm puncture, Laceration across right jawline to subcutaneous.   Psychiatric: His mood appears anxious. His affect is labile. His affect is not inappropriate. His speech is delayed. His speech is not rapid and/or pressured and not tangential. He is agitated and slowed. He is not  aggressive, not hyperactive, not withdrawn, not actively hallucinating and not combative. Thought content is not paranoid and not delusional. He expresses no homicidal and no suicidal ideation. He expresses no suicidal plans and no homicidal plans. He is noncommunicative.   Fluctuating cooperation with frequent outbursts and gross anxiety, ruminating on pain and "being treated unfairly" denies SI, HI, hallucinations, desire to harm self or others. He is inattentive.         INITIAL ASSESSMENT & PLAN, MEDICAL DECISION MAKING, ED COURSE  Aime Meloche is a 55yr male who presents with a chief complaint of dog bite.     Differential includes, but is not limited to: nerve/tendon/vascular injury, cellulitis, abscess, retained FB, among many others.      The results of the ED evaluation were notable for the following:        Chart Review: I reviewed the patient's prior medical records. 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    SUBJECTIVE:   66yo male sustained laceration of hand, finger, face and arm 3 hours ago. Ashby Dawes of injury: police dog assault. Tetanus vaccination status reviewed: last tetanus booster within 10 years, tetanus re-vaccination not indicated.     OBJECTIVE:   Patient appears well, vitals are normal. Too numerous lacerations/punctures, largest was 3 cm noted.  Description: no foreign bodies, puncture wound, ragged edges, contaminated, stellate, skin loss right AC fossa. Neurovascular and tendon structures are intact.    Closure with partial skin edge  approximation- staples used too numerous for accurate count over 15 staples, AC fossa closure 3 sutures with simple continuous with 4-0 Nylon.    ASSESSMENT:   Laceration as described.    PLAN:   Anesthesia with 1% Lidocaine with  Epinephrine. Wound cleansed, debrided of visible foreign material and necrotic tissue, and sutured. Antibiotic ointment and dressing applied.  Wound care instructions provided.  Observe for any signs of infection or other problems.  Return for suture removal in 10 days.      Patient Summary: Phys exam reveals near FROM NVI right hand with over 30 puncture wounds to subcutaneous volar and dorsal tricep/bicep, forearm, hand. Laceration across right jawline to subcutaneous. Doubt injury to nerve or tendon.  Pt's wounds received copious irrigation with saline, prescribed Augmentin, given 1LNSIV, Unasyn, Morphine, Ativan here. After local anesthetic infiltration, closure with over 20 staples, posterior tricep venous ligation with 8-stitch technique, antecubital wound approximation with only partial closure as large skin gone, no exposed vascular structures or nerve. Pt declined facial wound closure on numerous occasions, could no long tolerate further laceration repair. All wounds left at least partially open to allow drainage. Sheriff advised close observation of dog and immediate notification of animal control if animal exhibits any change in behavior or health. Pt advised frequent changes of bandages, some wound still slowly weeping, no active bleeding at time of discharge. Pt's questions illicited and answered, counseled on red flags which would warrant immediate return to ED, pt understands findings and directions, pt agrees with plan to follow-up with PCP.      LAST VITAL SIGNS:  Temp: (!) 35.6 C (96 F) (09/04/16 1211)  Temp src: Oral (09/04/16 1211)  Pulse: 102 (09/04/16 1753)  BP: 147/88 (09/04/16 1753)  Resp: 16 (09/04/16 1753)  SpO2: 98 % (09/04/16 1753)  Weight: 63.5 kg (140 lb)  (09/04/16 1211)      Clinical Impression: Numerous dog bite lacerations to right arm and face, cannot exclude nerve injury            Disposition: Discharge. Follow up with PCP. ED discharge instructions were reviewed and provided.      PATIENT'S GENERAL CONDITION:  Good: Vital signs are stable and within normal limits. Patient is conscious and comfortable. Indicators are excellent.     Electronically signed by: Tao Satz [place] Martino Tompson

## 2016-09-10 ENCOUNTER — Telehealth (HOSPITAL_BASED_OUTPATIENT_CLINIC_OR_DEPARTMENT_OTHER): Payer: Self-pay | Admitting: Cardiovascular Disease

## 2016-09-10 NOTE — Telephone Encounter (Signed)
Lm to r/s 4/11 apt with Dr. Aubery Lapping. Patient needs to be rescheduled within 3 weeks.

## 2016-09-12 ENCOUNTER — Encounter (HOSPITAL_BASED_OUTPATIENT_CLINIC_OR_DEPARTMENT_OTHER): Payer: Self-pay | Admitting: Cardiovascular Disease

## 2016-09-12 NOTE — Telephone Encounter (Signed)
Scheduled for 09/20/16

## 2016-09-13 ENCOUNTER — Emergency Department
Admission: AD | Admit: 2016-09-13 | Discharge: 2016-09-14 | Disposition: A | Discharge status: Home / Self Care | Payer: Medicaid (Managed Care) | Attending: Emergency Medicine | Admitting: Emergency Medicine

## 2016-09-13 DIAGNOSIS — W540XXD Bitten by dog, subsequent encounter: Secondary | ICD-10-CM | POA: Insufficient documentation

## 2016-09-13 DIAGNOSIS — S41151D Open bite of right upper arm, subsequent encounter: Secondary | ICD-10-CM

## 2016-09-13 DIAGNOSIS — Z4800 Encounter for change or removal of nonsurgical wound dressing: Principal | ICD-10-CM

## 2016-09-13 NOTE — ED Nursing Note (Signed)
SUTURES AND STAPLES - present need removal - placed on 09/04/16 - wound healed   Pt states "I'm getting it recheck for infection as I've been in jail got out 2 days ago and they didn't check it"

## 2016-09-13 NOTE — ED Triage Note (Signed)
Wound check. S/P police dog bite on 4/3. Has several sutures and staples in place. +swelling and redness. States not currently on abx, but took Augmentin from 4/3 to 4/9.

## 2016-09-13 NOTE — ED Triage Note (Addendum)
S/P dog bite 09/04/16, needs wound recheck.

## 2016-09-14 DIAGNOSIS — S41151D Open bite of right upper arm, subsequent encounter: Secondary | ICD-10-CM

## 2016-09-14 DIAGNOSIS — W540XXD Bitten by dog, subsequent encounter: Secondary | ICD-10-CM | POA: Insufficient documentation

## 2016-09-14 MED ORDER — AMOXICILLIN 500 MG-POTASSIUM CLAVULANATE 125 MG TABLET
1.00 | ORAL_TABLET | Freq: Three times a day (TID) | ORAL | 0 refills | Status: AC
Start: 2016-09-14 — End: 2016-09-19

## 2016-09-14 MED ORDER — AMOXICILLIN 500 MG-POTASSIUM CLAVULANATE 125 MG TABLET
500.00 mg | ORAL_TABLET | Freq: Once | ORAL | Status: AC
Start: 2016-09-14 — End: 2016-09-14
  Administered 2016-09-14: 500 mg via ORAL
  Filled 2016-09-14: qty 1

## 2016-09-14 MED ORDER — IBUPROFEN 600 MG TABLET
600.00 mg | ORAL_TABLET | Freq: Once | ORAL | Status: AC
Start: 2016-09-14 — End: 2016-09-14
  Administered 2016-09-14: 600 mg via ORAL
  Filled 2016-09-14: qty 1

## 2016-09-14 MED ORDER — IBUPROFEN 600 MG TABLET
600.00 mg | ORAL_TABLET | Freq: Four times a day (QID) | ORAL | 3 refills | Status: AC | PRN
Start: 2016-09-14 — End: 2016-10-24

## 2016-09-14 NOTE — ED Initial Note (Signed)
EMERGENCY DEPARTMENT PHYSICIAN NOTE - Bryan Martinez       Date of Service:   09/13/2016 10:30 PM Patient's PCP: No Pcp Per Patient   Note Started: 09/14/2016 01:25 DOB: 11-09-1950             Chief Complaint   Patient presents with    Wound Check, Instructed to Return     wound check after dog bite       The history provided by the patient and medical records.  Interpreter used: No    Bryan Martinez is a 66yr old male, with a past medical history significant for COPD, CHF w/ischemic CM and valvular heart disease w/MVR, CAD s/p CABG and an AICD, PAD and CHRONIC PAIN, who presents to the ED for a recheck he was instructed to do after sustaining a dog bite with extensive injury to his right upper extremity requiring repair in the ED 4/3 9 days ago. He was bitten by a police dog who required tazing to stop the attack. He is late returning and was placed on Augmentin which he finished 3 days ago. He had approx 20 staples placed with some sutures and declined a chin laceration repair. The wound is not more red or warm. He denies more pain or swelling. No discharge or pus and the wounds remain intact. He denies any new loss of sensory or motor function and is compliant with all his other medications and uses only ASA for anticoagulation.     Quality: ache  Location: right arm/forearm  Severity: 5/10  Time Course: constant  Progression: unchanged  Duration: 9 days  Palliative factors: rest makes it better.  Provocative factors: bending, twisting and movement makes it worse.  Associated symptoms: see HPI  Pertinent negatives: fever, redness, worse pain or swelling, drainage, see HPI and ROS for more.    A full history, including pertinent past medical and social history was reviewed and updated as necessary.    HISTORY:  There are no active hospital problems to display for this patient.   No Known Allergies   Past Medical History:  No date: Carotid stenosis  No date: Chronic anticoagulation  No date: Chronic  systolic heart failure  No date: COPD (chronic obstructive pulmonary disease)  No date: Coronary artery disease  11/03/2015: ICD (implantable cardioverter-defibrillator) i*  No date: Ischemic cardiomyopathy  No date: Mitral valve replaced  No date: PVD (peripheral vascular disease)  11/18/2013: S/P CABG (coronary artery bypass graft) Past Surgical History:  11/18/2013: Cabg unspec # vessels - hx only  11/03/2015: Hc bi-v icd device   Social History    Marital status: SINGLE              Spouse name:                       Years of education:                 Number of children:               Occupational History    None on file    Social History Main Topics    Smoking status: Light Tobacco Smoker  Packs/day: 0.25      Years: 0.00           Types: Cigarettes    Smokeless status: Current User                      Comment: pt has information to quit "thinking about              the patches not sure with doc if go well              with my drugs or not"    Alcohol use: No              Drug use: No              Sexual activity: Not on file          Other Topics            Concern    None on file    Social History Narrative    None on file     Review of patient's family history indicates:    Heart Disease                  Mother                    Heart Disease                  Father                    rhematoid arthritis [OTHER]    Sister                    Cancer                         Brother                     Comment: prostate         Review of Systems   Constitutional: Negative for activity change, chills, diaphoresis, fever and unexpected weight change.   HENT: Negative for congestion, dental problem, ear pain, rhinorrhea, sinus pain, sneezing, sore throat and trouble swallowing.    Eyes: Negative for pain and visual disturbance.   Respiratory: Negative for cough, chest tightness, shortness of breath and wheezing.    Cardiovascular: Positive for leg swelling.  Negative for chest pain and palpitations.   Gastrointestinal: Negative for abdominal distention, abdominal pain, blood in stool, constipation, diarrhea, nausea and vomiting.   Endocrine: Negative for polydipsia and polyuria.   Genitourinary: Negative for difficulty urinating, dysuria, flank pain, frequency, hematuria and urgency.   Musculoskeletal: Positive for back pain and myalgias. Negative for arthralgias, gait problem, joint swelling, neck pain and neck stiffness.   Skin: Positive for wound. Negative for color change, pallor and rash.   Allergic/Immunologic: Negative for environmental allergies and immunocompromised state.   Neurological: Negative for dizziness, syncope, weakness, light-headedness, numbness and headaches.   Hematological: Negative for adenopathy. Bruises/bleeds easily.   Psychiatric/Behavioral: Negative.    All other systems reviewed and are negative.      TRIAGE VITAL SIGNS:  Temp: 37.3 C (99.1 F) (09/13/16 2222)  Temp src: Temporal (09/13/16 2222)  Pulse: 97 (09/13/16 2222)  BP: 100/48 (09/13/16 2222)  Resp: 18 (09/13/16 2222)  SpO2: 98 % (09/13/16 2222)  Weight: 68 kg (150 lb) (09/13/16 2222)    Physical Exam  Constitutional: He is oriented to person, place, and time. He appears well-developed and well-nourished. He is active.  Non-toxic appearance. He does not have a sickly appearance. He does not appear ill. No distress.   HENT:   Head: Normocephalic and atraumatic.       Right Ear: External ear normal.   Left Ear: External ear normal.   Nose: Nose normal. No rhinorrhea.   Mouth/Throat: Uvula is midline, oropharynx is clear and moist and mucous membranes are normal. No oral lesions. No trismus in the jaw. Normal dentition. No uvula swelling. No oropharyngeal exudate.   Healing laceration right lower jawline without infection   Eyes: Conjunctivae, EOM and lids are normal. Pupils are equal, round, and reactive to light. Right conjunctiva is not injected. No scleral icterus.   Neck:  Trachea normal, normal range of motion and full passive range of motion without pain. Neck supple. No JVD present. No spinous process tenderness and no muscular tenderness present. No rigidity. No tracheal deviation and normal range of motion present. No thyromegaly present.   Cardiovascular: Normal rate, regular rhythm, S1 normal, S2 normal, normal heart sounds, intact distal pulses and normal pulses.    No murmur heard.  Pulmonary/Chest: Effort normal and breath sounds normal. No accessory muscle usage. No respiratory distress. He has no wheezes. He has no rales. He exhibits no tenderness, no bony tenderness and no deformity.   Abdominal: Soft. Normal appearance and bowel sounds are normal. He exhibits no distension, no ascites and no mass. There is no hepatosplenomegaly. There is no tenderness. There is no rigidity, no rebound, no guarding, no CVA tenderness, no tenderness at McBurney's point and negative Murphy's sign. No hernia.   Genitourinary:   Genitourinary Comments: deferred   Musculoskeletal: He exhibits no deformity.        Left elbow: He exhibits decreased range of motion, swelling and laceration. He exhibits no deformity. Tenderness found.        Left wrist: He exhibits decreased range of motion, tenderness and swelling. He exhibits no bony tenderness, no effusion, no crepitus and no deformity.        Right upper arm: He exhibits tenderness, swelling, edema and laceration. He exhibits no bony tenderness and no deformity.        Right forearm: He exhibits tenderness, swelling, edema and laceration. He exhibits no bony tenderness and no deformity.        Right hand: He exhibits decreased range of motion and tenderness. He exhibits no bony tenderness, normal two-point discrimination, normal capillary refill, no deformity and no swelling. Normal sensation noted. Normal strength noted.   Lymphadenopathy:     He has no cervical adenopathy.   Neurological: He is alert and oriented to person, place, and time.  He has normal strength. No cranial nerve deficit or sensory deficit. He exhibits normal muscle tone. Coordination and gait normal. GCS eye subscore is 4. GCS verbal subscore is 5. GCS motor subscore is 6.   Skin: Skin is warm and dry. Bruising, ecchymosis, laceration and lesion noted. No abrasion, no burn, no petechiae and no rash noted. He is not diaphoretic. No cyanosis or erythema. No pallor.   Multiple puncture wounds to volar and dorsal tricep/bicep and forearm. The largest 3cm antecubital fossa wound is closed with nylon sutures, the rest with staples. No sings or increase warmth, redness or drainage or fluid collection/abscess and wounds are all intact and appear to be healing. There is a healing laceration across right jawline also without infection.  Psychiatric: He has a normal mood and affect. His speech is normal and behavior is normal. Judgment and thought content normal. Cognition and memory are normal.   Nursing note and vitals reviewed.        INITIAL ASSESSMENT & PLAN, MEDICAL DECISION MAKING, ED COURSE  Bryan Martinez is a 41yr male who presents with a chief complaint of continued managemetn of his dog bite wounds primarily to his right dominant upper extremity. The wounds all appear to be healing well but not yet ready to have the sutures or staples removed. The cosmesis is poor anyway. No signs of ongoing infection and the plan will be to continue the antibiotics a while longer and have the wounds again rechecked in 4 days. No signs of neurovascular compromise distally. Review of the old record did not indicate exposure or injury to tendons, bones or joints. He has some traumatic edema which is expected. He was provided a sling as this may help with the swelling and pain. He has been taking ibuprofen with fair pain control. He will be encouraged to continue management as an outpatient as there will likely be additional measures needed in his recovery like PT through his primary  doctor.    Differential includes, but is not limited to: The patient presents after skin trauma. The differential includes: necrotizing fasciitis, abscess, lymphangitis, cellulitis, foreign body in skin, osteomyelitis or complications of swelling such as compartment syndrome or neurovascular compromise distally.      The results of the ED evaluation were notable for the following:    Pertinent lab results: No results found for this visit on 09/13/16.    Medications   Amoxicillin /Clavulanate  (AUGMENTIN) Tablet 500 mg (500 mg ORAL Given 09/14/16 0107)   Ibuprofen (MOTRIN) Tablet 600 mg (600 mg ORAL Given 09/14/16 0107)       Chart Review: I reviewed the patient's prior medical records. Pertinent information that is relevant to this encounter was a review of the prior encounters both ED and outpatient available in the electronic records as well as any previous pertinant labs or imaging if available.      Patient Summary:  The patient presents after extensive injury and dog bite to the right upper extremity for continued wound management. He will continue Augmentin as prescribed and ibuprofen for pain as needed. He should have the staples and sutures removed in 4 more days and can use the sling for comfort as needed and return immediately for new redness or fever or signs of infection.        LAST VITAL SIGNS:  Temp: 37.3 C (99.1 F) (09/13/16 2222)  Temp src: Temporal (09/13/16 2222)  Pulse: 97 (09/13/16 2222)  BP: 100/48 (09/13/16 2222)  Resp: 18 (09/13/16 2222)  SpO2: 98 % (09/13/16 2222)  Weight: 68 kg (150 lb) (09/13/16 2222)      Clinical Impression:     ICD-10-CM    1. Dog bite of multiple sites of right upper arm, subsequent encounter S41.151D     W54.0XXD          Disposition: Discharge. Follow up with PMD. ED discharge instructions were reviewed and provided.      PATIENT'S GENERAL CONDITION:  Good: Vital signs are stable and within normal limits. Patient is conscious and comfortable. Indicators are  excellent.     Electronically signed by: Bertell Maria, MD

## 2016-09-14 NOTE — Discharge Instructions (Signed)
Continue Augmentin as prescribed and ibuprofen for pain as needed. Elevate above your heart as much as reasonable for swelling which will help the pain. You will need to have the staple and sutures removed in 4 more days. Use the sling for comfort as needed. Return for new redness or fever or new symptoms.

## 2016-09-14 NOTE — ED Nursing Note (Signed)
DC to home unaccompanied - pt verbal understanding DC, Rx, Follow-up and suture removal in 4 days.  Pt sling intact, worn correctly.  CMSTP intact.  Pt ambulatory w/steady gait.  Pt given PO water.

## 2016-09-14 NOTE — ED Nursing Note (Signed)
Applied Bacitracin to multiple wounds on upper and lower right arm.  Wrapped arm with gauze and put into shoulder sling.  Patient tolerated well.

## 2016-09-20 ENCOUNTER — Ambulatory Visit (HOSPITAL_BASED_OUTPATIENT_CLINIC_OR_DEPARTMENT_OTHER): Payer: Self-pay | Admitting: Cardiovascular Disease

## 2016-10-16 DIAGNOSIS — H53001 Unspecified amblyopia, right eye: Secondary | ICD-10-CM | POA: Insufficient documentation

## 2016-11-21 ENCOUNTER — Ambulatory Visit (HOSPITAL_BASED_OUTPATIENT_CLINIC_OR_DEPARTMENT_OTHER): Payer: Medicaid (Managed Care) | Admitting: Cardiovascular Disease

## 2016-11-21 ENCOUNTER — Encounter (HOSPITAL_BASED_OUTPATIENT_CLINIC_OR_DEPARTMENT_OTHER): Payer: Self-pay | Admitting: Cardiovascular Disease

## 2016-11-21 ENCOUNTER — Other Ambulatory Visit (HOSPITAL_BASED_OUTPATIENT_CLINIC_OR_DEPARTMENT_OTHER): Payer: Self-pay | Admitting: Cardiovascular Disease

## 2016-11-21 VITALS — BP 104/64 | HR 96 | Resp 14 | Wt 134.0 lb

## 2016-11-21 VITALS — BP 104/64 | HR 96 | Resp 14 | Ht 68.0 in | Wt 134.0 lb

## 2016-11-21 DIAGNOSIS — I255 Ischemic cardiomyopathy: Principal | ICD-10-CM

## 2016-11-21 DIAGNOSIS — Z95810 Presence of automatic (implantable) cardiac defibrillator: Secondary | ICD-10-CM

## 2016-11-21 DIAGNOSIS — Z951 Presence of aortocoronary bypass graft: Secondary | ICD-10-CM

## 2016-11-21 DIAGNOSIS — I5022 Chronic systolic (congestive) heart failure: Secondary | ICD-10-CM

## 2016-11-21 DIAGNOSIS — Z952 Presence of prosthetic heart valve: Secondary | ICD-10-CM

## 2016-11-21 MED ORDER — SACUBITRIL 97 MG-VALSARTAN 103 MG TABLET
1.00 | ORAL_TABLET | Freq: Two times a day (BID) | ORAL | 3 refills | Status: DC
Start: 2016-11-21 — End: 2017-07-29

## 2016-11-21 MED ORDER — ATORVASTATIN 20 MG TABLET: 20.0000 mg | ORAL_TABLET | Freq: Every day | ORAL | 3 refills | Status: DC

## 2016-11-21 MED ORDER — FERROUS SULFATE 325 MG (65 MG IRON) TABLET
325.00 mg | ORAL_TABLET | Freq: Every day | ORAL | 3 refills | Status: AC
Start: 2016-11-21 — End: 2017-11-16

## 2016-11-21 NOTE — Progress Notes (Signed)
Bryan KickHoward Dale Martinez  MRN: [id number]  DOB: 01/23/1951    11/21/16    PCP:  No Pcp Per Patient    Chief complaint:    Chief Complaint   Patient presents with    Device Routine     7691m ICD check    Other     see HPI       HPI:  Bryan Martinez is a [age]yr with a history of Ischemic coronary disease status post coronary bypass grafting, chronic systolic heart failure, hypertension, hyperlipidemia.  Patient has been doing fairly well from a cardiovascular perspective.  His functional and exertional capacity stable.  Exertional capacity stable.  He exercises regularly and has not had any significant change in his exercise capacity over the course of the last 3 months.  His appetite has been poor and he has lost from on weight over the course of the last 6 months.      Allergies:  No Known Allergies    Medications:    Current Outpatient Prescriptions:     Aspirin 81 mg EC Tablet, Take 1 tablet by mouth every morning., Disp: 30 tablet, Rfl: 11    Atorvastatin (LIPITOR) 20 mg Tablet, Take 1 tablet by mouth every day., Disp: 90 tablet, Rfl: 3    ferrous sulfate 325 mg (65 mg iron) Tablet, Take 1 tablet by mouth every day., Disp: 90 tablet, Rfl: 3    krill oil 500 mg Capsule, Take 3 capsules by mouth every day., Disp: 90 capsule, Rfl: 11    MAGNESIUM PO, Take 1 tablet by mouth every morning., Disp: , Rfl:     Omeprazole (PRILOSEC) 20 mg Delayed Release Capsule, Take 1 capsule by mouth once daily before a meal., Disp: 30 capsule, Rfl: 0    PROAIR HFA 90 mcg/actuation inhaler, INHALE 2 PUFFS ORALLY EVERY 4 TO 6 HOURS AS NEEDED, Disp: , Rfl: 0    Sacubitril 97 mg/Valsartan 103 mg (ENTRESTO) 97-103 mg Tablet, Take 1 tablet by mouth 2 times daily., Disp: 180 tablet, Rfl: 3    Tamsulosin (FLOMAX) 0.4 mg Capsule, Take 1 capsule by mouth every day. Take 30 minutes after meals at the same times each day., Disp: 15 capsule, Rfl: 0    ubidecarenone/vitamin E mixed (COQ10 SG 100 PO), Take 200 mg by mouth every morning.         , Disp: , Rfl:     Reviewed and reconciled with patient.    ROS:  Per HPI    Temp: --  Temp src: --  Pulse: 96 (06/20 1031)  BP: 104/64 (06/20 1031)  Resp: 14 (06/20 1031)  SpO2: 97 % (06/20 1031)  Height: 172.7 cm (5\' 8" ) (06/20 1031)  Weight: 60.8 kg (134 lb) (06/20 1031)    Physical exam    Awake alert and appropriate.  Mood and affect normal.  Thin.  Well kept, appearing stated age.    Lungs clear to auscultation bilaterally with good air movement and normal excursion.  No wheezing, rhonchi or crackles.  No dullness to percussion.    Heart regular with normal S1S2.  2/6 apical systolic murmur.  No rubs or gallops noted.  PMI not palpated.  JVP 8 cm.    Abdomen flat, soft, non-tender, no distention.  Normal bowel sounds. Extremities warm.  2+ pulses throughout.  No edema.  No clubbing or cyanosis.  Moves all 4 extremities equally.  Normal bulk and tone.  Normal gait.    Data:    Lab Results  Lab Name Value Date/Time    WBC 6.4 06/28/2016 10:12 PM    HGB 12.5 (L) 06/28/2016 10:12 PM    HCT 36.8 (L) 06/28/2016 10:12 PM    PLT 153 06/28/2016 10:12 PM       Lab Results   Lab Name Value Date/Time    NA 138 06/28/2016 10:12 PM    K 3.9 06/28/2016 10:12 PM    CL 102 06/28/2016 10:12 PM    CO2 27 06/28/2016 10:12 PM    BUN 28 (H) 06/28/2016 10:12 PM    CR 1.02 06/28/2016 10:12 PM    GLU 146 (H) 06/28/2016 10:12 PM       No results found for: CHOL, LDLC, HDL, TRIG    No results found for: HGBA1C    No results found for: TSH, FT4     Lab Results   Lab Name Value Date/Time    AST 27 06/28/2016 10:12 PM    ALT 27 06/28/2016 10:12 PM    ALP 86 06/28/2016 10:12 PM    TP 7.2 06/28/2016 10:12 PM       No results found.    Problem List:  Problem List Items Addressed This Visit        Circulatory    Chronic systolic heart failure    Relevant Orders    ECHOCARDIOGRAM COMPLETE    Ischemic cardiomyopathy - Primary    Relevant Orders    ECHOCARDIOGRAM COMPLETE       Other    Mitral valve replaced    Relevant Orders     ECHOCARDIOGRAM COMPLETE    S/P CABG (coronary artery bypass graft)    Relevant Orders    ECHOCARDIOGRAM COMPLETE    ICD (implantable cardioverter-defibrillator) in place    Relevant Orders    ECHOCARDIOGRAM COMPLETE          Assessment and Plan:  This is a pleasant 20yr male with history as noted above.      Systolic heart failure: Symptomatically stable.  Progressive cachexia but no progressive symptoms of heart failure.  Repeating echocardiogram in 3 months.  Maintain close follow-up, plan to see the patient back in 4 weeks and then again in 3 months.  Resuming Entresto, discontinue losartan    Coronary disease: Symptomatically stable    Hypertension:  Well-controlled    Mitral valve replacement: Symptomatically stable.  Recheck echo in 3 months.    ICD: Normally functioning biventricular ICD        Sincerely,    Kentrell Hallahan R. Aubery Lapping, MD, [facility], [facility]  [facility]

## 2016-11-21 NOTE — Addendum Note (Signed)
Addended by: [name], Izabela Ow on: 11/21/2016 11:06 AM     Modules accepted: Orders

## 2016-11-21 NOTE — Telephone Encounter (Signed)
Pt is asking if you can send #30 of flomax to [facility]. He just needs 1 month worth until he gets in to see PCP

## 2016-11-22 MED ORDER — TAMSULOSIN 0.4 MG CAPSULE: 0.4000 mg | ORAL_CAPSULE | Freq: Every day | ORAL | 0 refills | Status: DC

## 2016-12-19 ENCOUNTER — Ambulatory Visit (HOSPITAL_BASED_OUTPATIENT_CLINIC_OR_DEPARTMENT_OTHER): Payer: Self-pay | Admitting: Nurse Practitioner

## 2016-12-19 ENCOUNTER — Other Ambulatory Visit (HOSPITAL_BASED_OUTPATIENT_CLINIC_OR_DEPARTMENT_OTHER): Payer: Self-pay | Admitting: Cardiovascular Disease

## 2016-12-19 MED ORDER — TAMSULOSIN 0.4 MG CAPSULE: 0.4000 mg | ORAL_CAPSULE | Freq: Every day | ORAL | 3 refills | Status: DC

## 2016-12-19 MED ORDER — ASPIRIN 81 MG TABLET,DELAYED RELEASE: 81.0000 mg | DELAYED_RELEASE_TABLET | Freq: Every day | ORAL | 11 refills | Status: DC

## 2016-12-19 NOTE — Telephone Encounter (Signed)
Lov 11/21/16  Nov 12/19/16

## 2016-12-26 ENCOUNTER — Ambulatory Visit (HOSPITAL_BASED_OUTPATIENT_CLINIC_OR_DEPARTMENT_OTHER): Payer: Medicaid (Managed Care) | Admitting: Nurse Practitioner

## 2016-12-26 ENCOUNTER — Encounter (HOSPITAL_BASED_OUTPATIENT_CLINIC_OR_DEPARTMENT_OTHER): Payer: Self-pay | Admitting: Nurse Practitioner

## 2016-12-26 VITALS — BP 78/48 | HR 84 | Resp 16 | Ht 68.0 in | Wt 134.0 lb

## 2016-12-26 DIAGNOSIS — I5022 Chronic systolic (congestive) heart failure: Principal | ICD-10-CM

## 2016-12-26 DIAGNOSIS — I10 Essential (primary) hypertension: Secondary | ICD-10-CM | POA: Insufficient documentation

## 2016-12-26 DIAGNOSIS — I2510 Atherosclerotic heart disease of native coronary artery without angina pectoris: Secondary | ICD-10-CM

## 2016-12-26 MED ORDER — OMEPRAZOLE 20 MG CAPSULE,DELAYED RELEASE: 20.0000 mg | DELAYED_RELEASE_CAPSULE | Freq: Every day | ORAL | 3 refills | Status: DC

## 2016-12-26 NOTE — Progress Notes (Signed)
Bryan Martinez  MRN:  [id number]  DOB: 11/10/1950  Date of Service:  12/26/2016      SUBJECTIVE    Bryan Martinez returns to the office for a routine cardiology follow up visit. He has a history of Ischemic coronary disease status post coronary bypass grafting, chronic systolic heart failure, hypertension, hyperlipidemia.  At the previous visit, losartan was discontinued and Entresto resumed. He has tolerated the change well. However, his blood pressure is rather low today 78/48. States his blood pressure has been higher at home, systolics 120 or less. No dizziness/near syncope. Upon reviewing his medications, appears he picked up carvedilol 12.5mg  BID at some point.  He continues to feel well from a cardiac perspective, no complaints.  His functional and exertional capacity remains stable.  No exertional chest pain, SOB/PND, or edema.      PAST MEDICAL HISTORY  Past Medical History:   Diagnosis Date    Carotid stenosis     Chronic anticoagulation     Chronic systolic heart failure     COPD (chronic obstructive pulmonary disease)     Coronary artery disease     ICD (implantable cardioverter-defibrillator) in place 11/03/2015    Ischemic cardiomyopathy     Mitral valve replaced     PVD (peripheral vascular disease)     S/P CABG (coronary artery bypass graft) 11/18/2013       SOCIAL HISTORY  History   Smoking Status    Light Tobacco Smoker    Packs/day: 0.25    Types: Cigarettes   Smokeless Tobacco    Current User     Comment: pt has information to quit "thinking about the patches not sure with doc if go well with my drugs or not"     History   Alcohol Use No       ALLERGIES:  No Known Allergies    CURRENT MEDS:  Current Outpatient Prescriptions on File Prior to Visit   Medication Sig Dispense Refill    Aspirin 81 mg EC Tablet Take 1 tablet by mouth every morning. 30 tablet 11    Atorvastatin (LIPITOR) 20 mg Tablet Take 1 tablet by mouth every day. 90 tablet 3    ferrous sulfate 325 mg (65 mg iron)  Tablet Take 1 tablet by mouth every day. 90 tablet 3    krill oil 500 mg Capsule Take 2 capsules by mouth every day.             90 capsule 11    MAGNESIUM PO Take 1 tablet by mouth every morning.      Omeprazole (PRILOSEC) 20 mg Delayed Release Capsule Take 1 capsule by mouth once daily before a meal. 30 capsule 0    PROAIR HFA 90 mcg/actuation inhaler INHALE 2 PUFFS ORALLY EVERY 4 TO 6 HOURS AS NEEDED  0    Sacubitril 97 mg/Valsartan 103 mg (ENTRESTO) 97-103 mg Tablet Take 1 tablet by mouth 2 times daily. 180 tablet 3    Tamsulosin (FLOMAX) 0.4 mg Capsule Take 1 capsule by mouth every day. Take 30 minutes after meals at the same times each day. 90 capsule 3    ubidecarenone/vitamin E mixed (COQ10 SG 100 PO) Take 200 mg by mouth every morning.                   No current facility-administered medications on file prior to visit.          OBJECTIVE    Gastrointestinal General No change in bowel  habits, no melena, no hematochezia, no nausea, vomiting or abdominal pain.   General/Constitutional  no fever or chills.   Respiratory See HPI no coughing, no wheezing.   Peripheral Vascular no edema.   Hematology/ Lymph General No bleeding or bruising, and no hematuria.   Cardiovascular See HPI No chest pain, SOB, syncope, presyncope, dizziness, lightheadedness, palpitations, racing heart beats, no orthopnea or PND.     ASSESSMENT   BP (!) 78/48  Pulse 84  Resp 16  Ht 1.727 m (5\' 8" )  Wt 60.8 kg (134 lb)  SpO2 96%  BMI 20.37 kg/m2    GENERAL::Alert, appropriate, in no acute distress, pleasant, calm, breathing easily on room air, Neck: No JVD in seated position. CARDIOVASCULAR:: Regular rate and rhythm, normal S1 and S2 with no murmur or gallop. CHEST:: Lungs clear, respirations unlabored, no crackles or wheezes, normal excursion. SKIN:: Uncovered skin is pink warm dry and intact. EXTREMITIES:: No edema.     Data:    Lab Results   Lab Name Value Date/Time    WBC 6.4 06/28/2016 10:12 PM    HGB 12.5 (L) 06/28/2016  10:12 PM    HCT 36.8 (L) 06/28/2016 10:12 PM    PLT 153 06/28/2016 10:12 PM       Lab Results   Lab Name Value Date/Time    NA 138 06/28/2016 10:12 PM    K 3.9 06/28/2016 10:12 PM    CL 102 06/28/2016 10:12 PM    CO2 27 06/28/2016 10:12 PM    BUN 28 (H) 06/28/2016 10:12 PM    CR 1.02 06/28/2016 10:12 PM    GLU 146 (H) 06/28/2016 10:12 PM       Lab Results   Lab Name Value Date/Time    AST 27 06/28/2016 10:12 PM    ALT 27 06/28/2016 10:12 PM    ALP 86 06/28/2016 10:12 PM    TP 7.2 06/28/2016 10:12 PM     PLAN    (I25.10) Coronary artery disease involving native coronary artery of native heart without angina pectoris  (primary encounter diagnosis)  Plan: Clinically stable; Continue current medical therapy; Reinforced healthy lifestyle behaviors, such has consuming a healthy diet and regular cardiovascular exercise.    (I50.22) Chronic systolic heart failure  Plan: Clinically stable; No s/s consistent with decompensated CHF; Consider Cardio MEMs per Dr Aubery LappingYoder. Notify the office of increase in SOB, weight, and/or edema.     (I10) Benign essential HTN  Plan: Although his blood pressure is very low today, he is asymptomatic; Advised he hold his medications tonight, then resume his carvedilol and entresto at 1/2 current dose.  Monitor blood pressure, keep record, provide a copy of the readings at the next office visit. Notify the office if he experiences symptomatic hypotension.    Return in 1-2 weeks or sooner should the need arise. Plan of care discussed with Dr Aubery LappingYoder. All questions and concerns addressed. Total of 25 minutes spent with the patient with over 15 minutes spent in coordination and counseling of care as noted above.       Roslyn SmilingWendy Jeannifer Drakeford, FNP  [facility]

## 2016-12-28 NOTE — Progress Notes (Signed)
Lm for pt to conf his follow up apt with wendy for 01/07/17 @ 1:00pm.

## 2017-01-07 ENCOUNTER — Ambulatory Visit (HOSPITAL_BASED_OUTPATIENT_CLINIC_OR_DEPARTMENT_OTHER): Payer: Self-pay | Admitting: Nurse Practitioner

## 2017-02-07 ENCOUNTER — Ambulatory Visit: Admission: RE | Admit: 2017-02-07 | Payer: Medicaid (Managed Care) | Source: Ambulatory Visit

## 2017-02-21 ENCOUNTER — Encounter (HOSPITAL_BASED_OUTPATIENT_CLINIC_OR_DEPARTMENT_OTHER): Payer: Self-pay | Admitting: Cardiovascular Disease

## 2017-02-21 ENCOUNTER — Ambulatory Visit (HOSPITAL_BASED_OUTPATIENT_CLINIC_OR_DEPARTMENT_OTHER): Payer: Medicaid (Managed Care) | Admitting: Cardiovascular Disease

## 2017-02-21 VITALS — BP 112/70 | HR 96 | Resp 16 | Ht 67.0 in | Wt 144.0 lb

## 2017-02-21 DIAGNOSIS — Z952 Presence of prosthetic heart valve: Secondary | ICD-10-CM

## 2017-02-21 DIAGNOSIS — I10 Essential (primary) hypertension: Secondary | ICD-10-CM

## 2017-02-21 DIAGNOSIS — I255 Ischemic cardiomyopathy: Secondary | ICD-10-CM

## 2017-02-21 DIAGNOSIS — I739 Peripheral vascular disease, unspecified: Secondary | ICD-10-CM

## 2017-02-21 DIAGNOSIS — Z951 Presence of aortocoronary bypass graft: Secondary | ICD-10-CM

## 2017-02-21 DIAGNOSIS — Z95810 Presence of automatic (implantable) cardiac defibrillator: Secondary | ICD-10-CM

## 2017-02-21 DIAGNOSIS — I5022 Chronic systolic (congestive) heart failure: Principal | ICD-10-CM

## 2017-02-21 DIAGNOSIS — I2510 Atherosclerotic heart disease of native coronary artery without angina pectoris: Secondary | ICD-10-CM

## 2017-02-21 NOTE — Progress Notes (Signed)
Bryan Martinez  MRN: [id number]  DOB: Dec 03, 1950    02/21/17    PCP:  No Pcp Per Patient    Chief complaint:    Chief Complaint   Patient presents with    Follow Up With Specialist     See HPI, meds verified with [facility] list       HPI:  Bryan Martinez is a 66yr with a history of ischemic coronary disease s/p CABG, severe systolic heart failure, hypertension, hyperlipidemia, ICD.  Patient has been feeling well.  Denies exertional chagnes over the last few months.  He is still walking regularly.Marland Kitchen He can get u pa flight of stairs without limitations. No edema. NO Orthopnea or PND.  Appetite and weight are good.  He has actually gained a little weight!    No chest pain. Stable exertional dyspnea. No edema. No heat or cold intolerance. No fevers or chills. No shocks from his ICD. Patient feels well and has no complaints currently.       Allergies:  No Known Allergies    Medications:    Current Outpatient Prescriptions:     Ascorbic Acid (V-R VITAMIN C) 500 mg Tablet, Take 500 mg by mouth every day., Disp: , Rfl:     Aspirin 81 mg EC Tablet, Take 1 tablet by mouth every morning., Disp: 30 tablet, Rfl: 11    Atorvastatin (LIPITOR) 20 mg Tablet, Take 1 tablet by mouth every day., Disp: 90 tablet, Rfl: 3    ferrous sulfate 325 mg (65 mg iron) Tablet, Take 1 tablet by mouth every day., Disp: 90 tablet, Rfl: 3    Hydrocodone 10 mg/Acetaminophen 325 mg (NORCO) per tablet, Take 1 tablet by mouth if needed., Disp: , Rfl:     Ibuprofen (MOTRIN) 800 mg Tablet, Take 800 mg by mouth if needed., Disp: , Rfl:     krill oil 500 mg Capsule, Take 2 capsules by mouth every day.        , Disp: 90 capsule, Rfl: 11    MAGNESIUM PO, Take 1 tablet by mouth every morning., Disp: , Rfl:     multivit-minerals/ferrous fum (MULTI VITAMIN PO), every day., Disp: , Rfl:     Omeprazole (PRILOSEC) 20 mg Delayed Release Capsule, Take 1 capsule by mouth once daily before a meal., Disp: 90 capsule, Rfl: 3    PROAIR HFA 90 mcg/actuation  inhaler, INHALE 2 PUFFS ORALLY EVERY 4 TO 6 HOURS AS NEEDED, Disp: , Rfl: 0    Sacubitril 97 mg/Valsartan 103 mg (ENTRESTO) 97-103 mg Tablet, Take 1 tablet by mouth 2 times daily., Disp: 180 tablet, Rfl: 3    Tamsulosin (FLOMAX) 0.4 mg Capsule, Take 1 capsule by mouth every day. Take 30 minutes after meals at the same times each day., Disp: 90 capsule, Rfl: 3    ubidecarenone/vitamin E mixed (COQ10 SG 100 PO), Take 200 mg by mouth every morning.        , Disp: , Rfl:     Reviewed and reconciled with patient.    Past medical history, surgical history, family history and social history reviewed with the patient.    ROS:  No dysuria or hematuria. No fevers or chills.  No nausea vomiting. Otherwise as per HPI    Temp: --  Temp src: --  Pulse: 96 (09/20 1007)  BP: 112/70 (09/20 1007)  Resp: 16 (09/20 1007)  SpO2: 94 % (09/20 1007)  Height: 170.2 cm ( ) (09/20 1007)  Weight: 65.3 kg (144 lb) (09/20  1007)    Physical exam    Awake alert and appropriate.  Mood and affect normal.  Well kept, appearing stated age.    Lungs hyperinflated, clear to auscultation bilaterally with decreased air movement and normal excursion. Diffuse wheezing and dry crackles. No  No dullness to percussion.    Heart distant, regular with normal S1S2.  No murmurs, rubs or gallops noted.  PMI mid-clavicular line and of normal intensity and duration.  JVP flat.    Abdomen soft, non-tender, no distention.  Normal bowel sounds. Extremities warm. 1+ left radial pulse. 2+ right radial pulse. 1+ bilateral dorsalis pedis pulses. No edema. No clubbing or cyanosis.  Moves all 4 extremities equally.  Normal bulk and tone.  Normal gait.      Data:    Lab Results   Lab Name Value Date/Time    WBC 6.4 06/28/2016 10:12 PM    HGB 12.5 (L) 06/28/2016 10:12 PM    HCT 36.8 (L) 06/28/2016 10:12 PM    PLT 153 06/28/2016 10:12 PM       Lab Results   Lab Name Value Date/Time    NA 138 06/28/2016 10:12 PM    K 3.9 06/28/2016 10:12 PM    CL 102 06/28/2016 10:12 PM     CO2 27 06/28/2016 10:12 PM    BUN 28 (H) 06/28/2016 10:12 PM    CR 1.02 06/28/2016 10:12 PM    GLU 146 (H) 06/28/2016 10:12 PM       No results found for: CHOL, LDLC, HDL, TRIG    No results found for: HGBA1C    No results found for: TSH, FT4     Lab Results   Lab Name Value Date/Time    AST 27 06/28/2016 10:12 PM    ALT 27 06/28/2016 10:12 PM    ALP 86 06/28/2016 10:12 PM    TP 7.2 06/28/2016 10:12 PM       No results found.    Problem List:  Problem List Items Addressed This Visit        Circulatory    Coronary artery disease    Chronic systolic heart failure - Primary    Ischemic cardiomyopathy    PVD (peripheral vascular disease)    Benign essential HTN       Other    Mitral valve replaced    S/P CABG (coronary artery bypass graft)    ICD (implantable cardioverter-defibrillator) in place          Assessment and Plan:  This is a pleasant 25yr male with history as noted above.      Systolic heart failure: Symptomatically stable. Well-controlled blood pressure heart rate. No shocks from his ICD. Continue current medications. Followup in 6 months with labs and ICD check an echo.    Hypertension: Well-controlled    Hyperlipidemia: Continue current medications    Coronary disease/CABG: Symptomatically stable with well modify risk factors.    Mitral valve replacement: Recheck echo in 6 months    ICD:  normal functioning device.      Sincerely,    Jerard Bays R. Aubery Lapping, MD, [facility], [facility]  [facility]

## 2017-07-29 ENCOUNTER — Emergency Department: Payer: Medicaid (Managed Care)

## 2017-07-29 ENCOUNTER — Emergency Department
Admission: AD | Admit: 2017-07-29 | Discharge: 2017-07-29 | Disposition: A | Discharge status: Home / Self Care | Payer: Medicaid (Managed Care) | Attending: Emergency Medicine | Admitting: Emergency Medicine

## 2017-07-29 DIAGNOSIS — J40 Bronchitis, not specified as acute or chronic: Secondary | ICD-10-CM | POA: Insufficient documentation

## 2017-07-29 DIAGNOSIS — Z950 Presence of cardiac pacemaker: Secondary | ICD-10-CM

## 2017-07-29 DIAGNOSIS — R079 Chest pain, unspecified: Secondary | ICD-10-CM | POA: Insufficient documentation

## 2017-07-29 DIAGNOSIS — I2510 Atherosclerotic heart disease of native coronary artery without angina pectoris: Secondary | ICD-10-CM

## 2017-07-29 DIAGNOSIS — Z720 Tobacco use: Secondary | ICD-10-CM

## 2017-07-29 DIAGNOSIS — J449 Chronic obstructive pulmonary disease, unspecified: Secondary | ICD-10-CM

## 2017-07-29 DIAGNOSIS — Z951 Presence of aortocoronary bypass graft: Secondary | ICD-10-CM

## 2017-07-29 DIAGNOSIS — I10 Essential (primary) hypertension: Secondary | ICD-10-CM

## 2017-07-29 DIAGNOSIS — R0789 Other chest pain: Principal | ICD-10-CM

## 2017-07-29 LAB — CBC WITH DIFFERENTIAL
BASOPHILS % AUTO: 0.7 % (ref 0.0–1.0)
BASOPHILS ABS AUTO: 0.1 10*3/uL (ref 0.0–0.1)
EOSINOPHIL % AUTO: 2.1 % (ref 0.0–4.0)
EOSINOPHIL ABS AUTO: 0.2 10*3/uL (ref 0.0–0.2)
HEMATOCRIT: 43.4 % (ref 38.0–51.0)
HEMOGLOBIN: 14.7 g/dL (ref 13.0–18.0)
IMMATURE GRANULOCYTES % AUTO: 0.2 % (ref 0.00–0.50)
IMMATURE GRANULOCYTES ABS AUTO: 0 10*3/uL (ref 0.0–0.0)
LYMPHOCYTE ABS AUTO: 1.2 10*3/uL — AB (ref 1.3–2.9)
LYMPHOCYTES % AUTO: 14.5 % (ref 5.0–41.0)
MCH: 28.8 pg (ref 27.0–34.0)
MCHC G/DL: 33.9 g/dL (ref 33.0–37.0)
MCV: 85.1 fL (ref 82.0–99.0)
MONOCYTES % AUTO: 7.2 % (ref 0.0–10.0)
MONOCYTES ABS AUTO: 0.6 10*3/uL (ref 0.3–0.8)
MPV: 9.1 fL — AB (ref 9.4–12.4)
NEUTROPHIL ABS AUTO: 6.07 10*3/uL — AB (ref 2.20–4.80)
NEUTROPHILS % AUTO: 75.3 % — AB (ref 45.0–75.0)
NUCLEATED CELL COUNT: 0 10*3/uL (ref 0.0–0.1)
NUCLEATED RBC/100 WBC: 0 %{WBCs} (ref <=0.0)
PLATELET COUNT: 225 10*3/uL (ref 151–365)
RDW: 12.5 % (ref 11.5–14.5)
RED CELL COUNT: 5.1 10*6/uL (ref 4.10–5.65)
WHITE BLOOD CELL COUNT: 8.1 10*3/uL (ref 4.2–10.8)

## 2017-07-29 LAB — COMPREHENSIVE METABOLIC PANEL
ALANINE TRANSFERASE (ALT): 13 U/L (ref 4–56)
ALB/GLOB RATIO_MMC: 1.1 (ref 1.0–1.6)
ALBUMIN: 3.6 g/dL (ref 3.2–4.7)
ALKALINE PHOSPHATASE (ALP): 78 U/L (ref 38–126)
ASPARTATE TRANSAMINASE (AST): 22 U/L (ref 9–44)
BILIRUBIN TOTAL: 0.7 mg/dL (ref 0.1–2.2)
BUN/CREATININE_MMC: 16.3 (ref 7.3–21.7)
CALCIUM: 8.7 mg/dL (ref 8.7–10.2)
CARBON DIOXIDE TOTAL: 23 mmol/L (ref 22–32)
CHLORIDE: 105 mmol/L (ref 99–109)
CREATININE BLOOD: 0.98 mg/dL (ref 0.50–1.30)
E-GFR, NON-AFRICAN AMERICAN: 77 mL/min/{1.73_m2} (ref 60–?)
E-GFR_MMC: 77 mL/min/{1.73_m2} (ref 60–?)
GLOBULIN BLOOD_MMC: 3.3 g/dL (ref 2.2–4.2)
GLUCOSE: 139 mg/dL — AB (ref 70–99)
POTASSIUM: 3.9 mmol/L (ref 3.5–5.2)
PROTEIN: 6.9 g/dL (ref 5.9–8.2)
SODIUM: 137 mmol/L (ref 134–143)
UREA NITROGEN, BLOOD (BUN): 16 mg/dL (ref 6–21)

## 2017-07-29 LAB — TROPONIN I
TROPONIN I_MMC: 0.01 ng/mL (ref <=0.03)
TROPONIN I_MMC: 0.01 ng/mL (ref <=0.03)

## 2017-07-29 LAB — CK-MB PANEL (CK-ISO)
CK-MB_MMC: 3.4 ng/mL (ref <=4.0)
CREATINE KINASE, TOTAL_MMC: 135 U/L (ref 35–232)
RELATIVE INDEX: 2.5 (ref 0.0–2.5)

## 2017-07-29 MED ORDER — ASPIRIN 81 MG TABLET,DELAYED RELEASE: 81.0000 mg | DELAYED_RELEASE_TABLET | Freq: Every day | ORAL | 0 refills | Status: DC

## 2017-07-29 MED ORDER — ALBUTEROL SULFATE CONCENTRATE 2.5 MG/0.5 ML SOLUTION FOR NEBULIZATION
2.5000 mg | INHALATION_SOLUTION | Freq: Once | RESPIRATORY_TRACT | Status: AC
  Administered 2017-07-29: 2.5 mg via RESPIRATORY_TRACT

## 2017-07-29 MED ORDER — NITROGLYCERIN 0.4 MG SUBLINGUAL TABLET
0.40 mg | SUBLINGUAL_TABLET | SUBLINGUAL | Status: DC
Start: 2017-07-29 — End: 2017-07-29
  Administered 2017-07-29: 0.4 mg via SUBLINGUAL
  Filled 2017-07-29: qty 25

## 2017-07-29 MED ORDER — BENZONATATE 100 MG CAPSULE
100.00 mg | ORAL_CAPSULE | Freq: Once | ORAL | Status: AC
Start: 2017-07-29 — End: 2017-07-29
  Administered 2017-07-29: 100 mg via ORAL
  Filled 2017-07-29: qty 1

## 2017-07-29 MED ORDER — KRILL OIL 500 MG CAPSULE
2.00 | ORAL_CAPSULE | Freq: Every day | ORAL | 0 refills | Status: AC
Start: 2017-07-29 — End: 2017-08-28

## 2017-07-29 MED ORDER — NACL 0.9% IV BOLUS - DURATION REQ
500.00 mL | Freq: Once | INTRAVENOUS | Status: DC
Start: 2017-07-29 — End: 2017-07-29

## 2017-07-29 MED ORDER — ASCORBIC ACID (VITAMIN C) 500 MG TABLET
500.00 mg | ORAL_TABLET | Freq: Every day | ORAL | 0 refills | Status: AC
Start: 2017-07-29 — End: 2017-08-28

## 2017-07-29 MED ORDER — TAMSULOSIN 0.4 MG CAPSULE: 0.4000 mg | ORAL_CAPSULE | Freq: Every day | ORAL | 3 refills | Status: DC

## 2017-07-29 MED ORDER — SACUBITRIL 97 MG-VALSARTAN 103 MG TABLET
1.00 | ORAL_TABLET | Freq: Two times a day (BID) | ORAL | 0 refills | Status: DC
Start: 2017-07-29 — End: 2017-08-05

## 2017-07-29 MED ORDER — ALBUTEROL SULFATE CONCENTRATE 2.5 MG/0.5 ML SOLUTION FOR NEBULIZATION
INHALATION_SOLUTION | RESPIRATORY_TRACT | Status: AC
  Filled 2017-07-29: qty 0.5

## 2017-07-29 MED ORDER — BENZONATATE 100 MG CAPSULE
100.00 mg | ORAL_CAPSULE | Freq: Three times a day (TID) | ORAL | 0 refills | Status: DC | PRN
Start: 2017-07-29 — End: 2017-09-12

## 2017-07-29 MED ORDER — DOXYCYCLINE HYCLATE 100 MG TABLET
100.00 mg | ORAL_TABLET | Freq: Two times a day (BID) | ORAL | 0 refills | Status: AC
Start: 2017-07-29 — End: 2017-08-08

## 2017-07-29 MED ORDER — OMEPRAZOLE 20 MG CAPSULE,DELAYED RELEASE: 20.0000 mg | DELAYED_RELEASE_CAPSULE | Freq: Every day | ORAL | 3 refills | Status: DC

## 2017-07-29 MED ORDER — ALBUTEROL SULFATE HFA 90 MCG/ACTUATION AEROSOL INHALER: 1.0000 | INHALATION_SPRAY | RESPIRATORY_TRACT | 0 refills | Status: DC | PRN

## 2017-07-29 MED ORDER — SACUBITRIL 97 MG-VALSARTAN 103 MG TABLET
1.00 | ORAL_TABLET | Freq: Once | ORAL | Status: AC
Start: 2017-07-29 — End: 2017-07-29
  Administered 2017-07-29: 1 via ORAL
  Filled 2017-07-29: qty 1

## 2017-07-29 MED ORDER — ALBUTEROL SULFATE CONCENTRATE 2.5 MG/0.5 ML SOLUTION FOR NEBULIZATION
2.5000 mg | INHALATION_SOLUTION | Freq: Once | RESPIRATORY_TRACT | Status: AC
  Administered 2017-07-29: 2.5 mg via RESPIRATORY_TRACT
  Filled 2017-07-29: qty 1

## 2017-07-29 MED ORDER — ATORVASTATIN 20 MG TABLET: 20.0000 mg | ORAL_TABLET | Freq: Every day | ORAL | 3 refills | Status: DC

## 2017-07-29 NOTE — ED Nursing Note (Signed)
Dr Virginia RochesterGilpen at the bedside speaking with pt who was resting comfortably on gurney.

## 2017-07-29 NOTE — ED Nursing Note (Signed)
Patient discharged from ED with AVS, Rx, related instructions and all belongings. Patient is in NAD, is awake/alert skin, is pink/warm/dry. Pt leaves the ED ambulatory with law enforcement.

## 2017-07-29 NOTE — ED Nursing Note (Signed)
Dr Gilpen at the bedside.

## 2017-07-29 NOTE — ED Nursing Note (Signed)
Pt appears asleep on gurney.

## 2017-07-29 NOTE — ED Nursing Note (Signed)
Pt's BP elevated to 115/50 then 124/45. Pt feels better but still has some CP. Per Dr Virginia RochesterGilpen redraw trop, hold IVF and NTG.

## 2017-07-29 NOTE — ED Nursing Note (Signed)
Dr Virginia RochesterGilpen notified of pt's BP after NTG. Orders for fluid bolus obtained.

## 2017-07-29 NOTE — ED Provider Notes (Signed)
EMERGENCY DEPARTMENT PHYSICIAN NOTE - Bryan Martinez       Date of Service:   07/29/2017  1:10 PM Patient's PCP: Bryan Martinez   Note Started: 07/29/2017 14:20 DOB: 08/09/1950             Chief Complaint   Patient presents with    Other     Cc: fit for jail clearance     HPI:  67 year old history of htn, hyperlipidemia, cad, pacemaker, cabg  Was arrested today  States coughing  5-6 days green phlegm  States sob x 5 days  rhattle in chest mild  Also uri sx  No fever  Using inhaler  No cough medicine      States 10# weight loss last 2-3 days not eating, decrease appetitie  Dark stool today x 1 (guaic neg in ER)  Dull aching chest pain intermittently today, nothing increases or decreases pain,   Most recently dull pain 5/10 x few hours, worse with coughing, breathing,    The history provided by the patient.  Interpreter used: No    A full history, including pertinent past medical and social history was reviewed and updated as necessary.    HISTORY:  There are no active hospital problems to display for this patient.   Allergies   Allergen Reactions    Penicillins Hives      Past Medical History:  No date: Carotid stenosis  No date: Chronic anticoagulation  No date: Chronic systolic heart failure  No date: COPD (chronic obstructive pulmonary disease)  No date: Coronary artery disease  11/03/2015: ICD (implantable cardioverter-defibrillator) in place  No date: Ischemic cardiomyopathy  No date: Mitral valve replaced  No date: PVD (peripheral vascular disease)  11/18/2013: S/P CABG (coronary artery bypass graft) Past Surgical History:  11/18/2013: Cabg unspec # vessels - hx only  11/03/2015: Hc bi-v icd device   Social History    Socioeconomic History      Marital status: SINGLE      Spouse name: Not on file      Number of children: Not on file      Years of education: Not on file      Highest education level: Not on file    Occupational History      Not on file    Social Needs      Financial resource strain: Not on  file      Food insecurity:        Worry: Not on file        Inability: Not on file      Transportation needs:        Medical: Not on file        Non-medical: Not on file    Tobacco Use      Smoking status: Light Tobacco Smoker        Packs/day: 0.25        Types: Cigarettes      Smokeless tobacco: Current User      Tobacco comment: pt has information to quit "thinking about the patches not sure with doc if go well with my drugs or not"    Substance and Sexual Activity      Alcohol use: No      Drug use: No      Sexual activity: Not on file    Lifestyle      Physical activity:        Days per week: Not on file  Minutes per session: Not on file      Stress: Not on file    Relationships      Social connections:        Talks on phone: Not on file        Gets together: Not on file        Attends religious service: Not on file        Active member of club or organization: Not on file        Attends meetings of clubs or organizations: Not on file        Relationship status: Not on file      Intimate partner violence:        Fear of current or ex partner: Not on file        Emotionally abused: Not on file        Physically abused: Not on file        Forced sexual activity: Not on file    Other Topics      Concerns:        Not on file    Social History Narrative      Not on file   Review of patient's family history indicates:  Problem: Heart Disease      Relation: Mother          Age of Onset: (Not Specified)  Problem: Heart Disease      Relation: Father          Age of Onset: (Not Specified)  Problem: Other (rhematoid arthritis)      Relation: Sister          Age of Onset: (Not Specified)  Problem: Cancer      Relation: Brother          Age of Onset: (Not Specified)          Comment: prostate       ROS --    Positives and pertinent negatives as per HPI and included review of systems. All other systems were reviewed and were negative.      Review of Systems   Constitutional: Positive for unexpected weight change.  Negative for chills and fever.   HENT: Positive for congestion and sore throat.    Respiratory: Positive for cough, chest tightness, shortness of breath and wheezing.    Cardiovascular: Positive for chest pain.   Gastrointestinal: Negative for abdominal pain.       TRIAGE VITAL SIGNS:  Temp: 36.1 C (97 F) (07/29/17 1312)  Temp src: Temporal (07/29/17 1312)  Pulse: 70 (07/29/17 1312)  BP: 130/73 (07/29/17 1312)  Resp: 18 (07/29/17 1312)  SpO2: 93 % (07/29/17 1312)  Weight: 63.5 kg (140 lb) (07/29/17 1312)    Physical Exam   Constitutional: He is oriented to person, place, and time and well-developed, well-nourished, and in no distress. No distress.   HENT:   Head: Normocephalic and atraumatic.   Right Ear: External ear normal.   Left Ear: External ear normal.   Mouth/Throat: No oropharyngeal exudate.   Eyes: Pupils are equal, round, and reactive to light. Conjunctivae are normal.   Neck: Normal range of motion. Neck supple. No JVD present.   Cardiovascular: Normal rate, regular rhythm and normal heart sounds. Exam reveals no gallop.   No murmur heard.  Pulmonary/Chest: Effort normal. He has wheezes. He exhibits tenderness.   Coughing, wheezing  Reproducible chest pain   Abdominal: Soft. Bowel sounds are normal.   Genitourinary: Rectal exam shows guaiac negative  stool.   Genitourinary Comments: Chap by RN   Musculoskeletal: Normal range of motion. He exhibits no edema or tenderness.   Neurological: He is alert and oriented to person, place, and time. He exhibits normal muscle tone. Coordination normal.   Skin: Skin is warm and dry.   Psychiatric: Affect and judgment normal.   Nursing note and vitals reviewed.        INITIAL ASSESSMENT & PLAN, MEDICAL DECISION MAKING, ED COURSE  Bryan Martinez is a 33yr male who presents with a chief complaint of chest pain, cough, shortness of breath.     Differential includes, but is not limited to: mi, acs, angina, bronchitis, pneumonia, ptx, pulm embolism, aortic  disection  Plan on labs, cxr, ekg    Results for orders placed or performed during the hospital encounter of 07/29/17   CBC WITH DIFFERENTIAL     Status: Abnormal   Result Value Status    White Blood Cell Count 8.1 Final    Red Blood Cell Count 5.10 Final    Hemoglobin 14.7 Final    Hematocrit 43.4 Final    MCV 85.1 Final    MCH 28.8 Final    MCHC 33.9 Final    RDW 12.5 Final    MPV 9.1 (L) Final    Platelet Count 225 Final    Neutrophils % Auto 75.3 (H) Final    Lymphocytes % Auto 14.5 Final    Monocytes % Auto 7.2 Final    Eosinophil % Auto 2.1 Final    Basophils % Auto 0.7 Final    Immature Granulocytes % 0.20 Final    Neutrophil Abs Auto 6.07 (H) Final    Lymphocyte Abs Auto 1.2 (L) Final    Monocytes Abs Auto 0.6 Final    Eosinophil Abs Auto 0.2 Final    Basophils Abs Auto 0.1 Final    Nucleated RBC % Auto 0.0 Final    NRBC Abs Auto 0.0 Final    Immature Granulocytes Abs Auto 0.0 Final   COMPREHENSIVE METABOLIC PANEL     Status: Abnormal   Result Value Status    Sodium 137 Final    Potassium 3.9 Final    Chloride 105 Final    Carbon Dioxide Total 23 Final    Urea Nitrogen, Blood (BUN) 16 Final    Glucose 139 (H) Final    Calcium 8.7 Final    Protein 6.9 Final    Albumin 3.6 Final    Alkaline Phosphatase (ALP) 78 Final    Aspartate Transaminase (AST) 22 Final    Bilirubin Total 0.7 Final    Alanine Transferase (ALT) 13 Final    Creatinine Serum 0.98 Final    Globulin 3.3 Final    Alb/Glob Ratio 1.1 Final    BUN/ Creatinine 16.3 Final    E-GFR 77 Final    E-GFR, Non-African American 77 Final   TROPONIN I     Status: Normal   Result Value Status    Troponin [facility] 0.01 Final   CK-MB PANEL (CK-ISO)     Status: Normal   Result Value Status    CK-MB 3.4 Final    Relative Index 2.5 Final    Creatine Kinase, Total 135 Final   TROPONIN I     Status: Normal   Result Value Status    Troponin [facility] 0.01 Final       [facility] Chest 2 View  IMPRESSION: 1.. Post-CABG changes, left atrial clip, and ICD. 2. No evidence of acute  cardiopulmonary  process and no significant change compared to March 12, 2016.      Ekg: paced at 103, no st elevation or depression    ER meds:  Medications   Benzonatate (TESSALON) Capsule 100 mg (100 mg ORAL Given 07/29/17 1514)   Albuterol (PROVENTIL, VENTOLIN) 2.5 mg/0.5 mL Solution for Nebulization 2.5 mg (2.5 mg NEBULIZATION Given 07/29/17 1437)   Albuterol (PROVENTIL, VENTOLIN) 2.5 mg/0.5 mL Solution for Nebulization 2.5 mg (2.5 mg NEBULIZATION Given 07/29/17 1504)   ALBUTEROL SULFATE CONCENTRATE 2.5 MG/0.5 ML SOLUTION FOR NEBULIZATION (not administered)   Sacubitril 97 mg/Valsartan 103 mg (ENTRESTO) Tablet 1 tablet (1 tablet ORAL Given 07/29/17 1910)     Received albuterol treatment  Tessalon  Feels better, sleeping  Still having mild chest discomfort  Received ntg, blood pressure low afterwards but then subsequently resolved  Comfortable thought still mild chest pain which seems atypical (worse with coughing)    LAST VITAL SIGNS:  Temp: 36.1 C (97 F) (07/29/17 1312)  Temp src: Temporal (07/29/17 1312)  Pulse: 70 (07/29/17 1312)  BP: 130/73 (07/29/17 1312)  Resp: 18 (07/29/17 1312)  SpO2: 93 % (07/29/17 1312)  Weight: 63.5 kg (140 lb) (07/29/17 1312)         Clinical Impression: chest pain, bronchitis    Condition: fair    Dispo: home (law enforecement)        Electronically signed by: Regina Eck, MD

## 2017-07-29 NOTE — ED Nursing Note (Signed)
Report to Wendy, RN.

## 2017-07-29 NOTE — ED Triage Note (Signed)
Here for fit for incarceration. Pt without complaint. Hx pacemaker, x2 bypass with valve replacement. Denies CP states intermittent SOB x3 years, no increased respiratory effort noted.

## 2017-07-29 NOTE — Discharge Instructions (Addendum)
See pmd/clinic for reeval  Continue current medications  Return new/worse symptoms  Add doxycycline twice daily for cough  Add tessalon every 8 hours cough  Continue albuterol every 6 hours as needed wheezing  '  Clear for jail/incarceration

## 2017-07-30 LAB — ELECTROCARDIOGRAM WITH RHYTHM STRIP: QTC: 513 ms

## 2017-08-05 ENCOUNTER — Telehealth (HOSPITAL_BASED_OUTPATIENT_CLINIC_OR_DEPARTMENT_OTHER): Payer: Self-pay | Admitting: Cardiovascular Disease

## 2017-08-05 NOTE — Telephone Encounter (Signed)
Valsartan 80mg daily

## 2017-08-05 NOTE — Telephone Encounter (Signed)
Dr. Okey Dupreose is notified

## 2017-08-05 NOTE — Telephone Encounter (Signed)
[facility] if [facility] is an alternative to [employer]. He states he is not able to get pt this medication.  [phone]

## 2017-08-22 ENCOUNTER — Ambulatory Visit
Admission: RE | Admit: 2017-08-22 | Discharge: 2017-08-22 | Disposition: A | Discharge status: Home / Self Care | Payer: PRIVATE HEALTH INSURANCE | Source: Ambulatory Visit | Attending: Cardiovascular Disease | Admitting: Cardiovascular Disease

## 2017-08-22 DIAGNOSIS — I739 Peripheral vascular disease, unspecified: Secondary | ICD-10-CM

## 2017-08-22 DIAGNOSIS — Z95810 Presence of automatic (implantable) cardiac defibrillator: Secondary | ICD-10-CM

## 2017-08-22 DIAGNOSIS — I2510 Atherosclerotic heart disease of native coronary artery without angina pectoris: Secondary | ICD-10-CM

## 2017-08-22 DIAGNOSIS — Z952 Presence of prosthetic heart valve: Secondary | ICD-10-CM

## 2017-08-22 DIAGNOSIS — I10 Essential (primary) hypertension: Secondary | ICD-10-CM

## 2017-08-22 DIAGNOSIS — Z951 Presence of aortocoronary bypass graft: Secondary | ICD-10-CM

## 2017-08-22 DIAGNOSIS — I5022 Chronic systolic (congestive) heart failure: Principal | ICD-10-CM

## 2017-08-22 DIAGNOSIS — I255 Ischemic cardiomyopathy: Secondary | ICD-10-CM

## 2017-09-12 ENCOUNTER — Encounter (HOSPITAL_BASED_OUTPATIENT_CLINIC_OR_DEPARTMENT_OTHER): Payer: Self-pay | Admitting: Cardiovascular Disease

## 2017-09-12 ENCOUNTER — Ambulatory Visit (HOSPITAL_BASED_OUTPATIENT_CLINIC_OR_DEPARTMENT_OTHER): Payer: Medicaid (Managed Care) | Admitting: Cardiovascular Disease

## 2017-09-12 VITALS — BP 142/80 | HR 60 | Resp 12 | Ht 67.0 in | Wt 150.0 lb

## 2017-09-12 DIAGNOSIS — Z7901 Long term (current) use of anticoagulants: Secondary | ICD-10-CM

## 2017-09-12 DIAGNOSIS — I2510 Atherosclerotic heart disease of native coronary artery without angina pectoris: Principal | ICD-10-CM

## 2017-09-12 DIAGNOSIS — I5022 Chronic systolic (congestive) heart failure: Secondary | ICD-10-CM

## 2017-09-12 DIAGNOSIS — Z95810 Presence of automatic (implantable) cardiac defibrillator: Secondary | ICD-10-CM

## 2017-09-12 DIAGNOSIS — I739 Peripheral vascular disease, unspecified: Secondary | ICD-10-CM

## 2017-09-12 DIAGNOSIS — Z952 Presence of prosthetic heart valve: Secondary | ICD-10-CM

## 2017-09-12 DIAGNOSIS — Z951 Presence of aortocoronary bypass graft: Secondary | ICD-10-CM

## 2017-09-12 MED ORDER — CARVEDILOL 6.25 MG TABLET: 6.2500 mg | ORAL_TABLET | Freq: Two times a day (BID) | ORAL | 11 refills | Status: DC

## 2017-09-12 NOTE — Progress Notes (Signed)
Bryan KickHoward Dale Anglemyer  MRN: [id number]  DOB: 12/11/1950    09/12/17    PCP:  Patient, No Pcp Per    Chief complaint:    Chief Complaint   Patient presents with    Device Check     4270m f/u with echo and ICD check: CAD, systolic heart failure, PVD    Other     see HPI, medications reconciled w/pt by memory       HPI:  Bryan Martinez is a [age]yr with a history of ischemic coronary disease, systolic heart failure, hypertension, hyperlipidemia and ICD placement.    Patient has been feeling well.  Functional exertional capacity are stable.  No exertional symptoms of chest pain or shortness of breath.  No orthopnea.  No paroxysmal nocturnal dyspnea.  No syncope or presyncope.  Occasional mild dependent edema but generally no issues there.  No shocks from his ICD.  Overall, feeling well.  Functional capacity is good.    Allergies:    Penicillins    Hives    Medications:    Current Outpatient Medications:     Albuterol (PROAIR HFA) 90 mcg/actuation inhaler, Take 1-2 puffs by inhalation every 4 to 6 hours if needed for wheezing., Disp: 8.5 g, Rfl: 0    Aspirin 81 mg EC Tablet, Take 1 tablet by mouth every morning., Disp: 30 tablet, Rfl: 0    Atorvastatin (LIPITOR) 20 mg Tablet, Take 1 tablet by mouth every day at bedtime., Disp: 90 tablet, Rfl: 3    Carvedilol (COREG) 6.25 mg Tablet, Take 1 tablet by mouth 2 times daily with meals., Disp: 60 tablet, Rfl: 11    DiphenhydrAMINE (BENADRYL) 25 mg Capsule, Take by mouth every 6 hours if needed., Disp: , Rfl:     ferrous sulfate 325 mg (65 mg iron) Tablet, Take 1 tablet by mouth every day. (Patient taking differently: Take 325 mg by mouth every morning.       ), Disp: 90 tablet, Rfl: 3    Ibuprofen (MOTRIN) 800 mg Tablet, Take 800 mg by mouth every 8 hours if needed for pain.       , Disp: , Rfl:     Losartan (COZAAR) 50 mg Tablet, Take 50 mg by mouth every day., Disp: , Rfl:     Omeprazole (PRILOSEC) 20 mg Delayed Release Capsule, Take 1 capsule by mouth every morning.,  Disp: 90 capsule, Rfl: 3    Reviewed and reconciled with patient.    Past medical history, surgical history, family history and social history reviewed with the patient.    ROS:  Per HPI    Temp: --  Temp src: --  Pulse: 60 (04/11 1313)  BP: 142/80 (04/11 1313)  Resp: 12 (04/11 1313)  SpO2: 92 % (04/11 1313)  Height: 170.2 cm (5\' 7" ) (04/11 1313)  Weight: 68 kg (150 lb) (04/11 1313)    Physical exam    Awake alert appropriate. Breathing easily on room air.    RRR S1S2.  No MRG.  PMI not palpated lungs clear to auscultation bilaterally. Good air movement. Normal excursion. No wheezing, rhonchi or crackles.  Abdomen soft. NT, ND.  Normal bowel sounds. NO masses or organomegaly.  No rebound or guarding.  Extremities warm.  2+ radial and 1+ DP pulses. No edema.  Moves all 4 extremities symetrically.  Normal bulk and tone.    Data:    Lab Results   Lab Name Value Date/Time    WBC 8.1 07/29/2017 02:51 PM  HGB 14.7 07/29/2017 02:51 PM    HCT 43.4 07/29/2017 02:51 PM    PLT 225 07/29/2017 02:51 PM       Lab Results   Lab Name Value Date/Time    NA 137 07/29/2017 02:51 PM    K 3.9 07/29/2017 02:51 PM    CL 105 07/29/2017 02:51 PM    CO2 23 07/29/2017 02:51 PM    BUN 16 07/29/2017 02:51 PM    CR 0.98 07/29/2017 02:51 PM    GLU 139 (H) 07/29/2017 02:51 PM       No results found for: CHOL, LDLC, HDL, TRIG    No results found for: HGBA1C    No results found for: TSH, FT4     Lab Results   Lab Name Value Date/Time    AST 22 07/29/2017 02:51 PM    ALT 13 07/29/2017 02:51 PM    ALP 78 07/29/2017 02:51 PM    TP 6.9 07/29/2017 02:51 PM       No results found.    Problem List:  Problem List Items Addressed This Visit        Circulatory    Coronary artery disease - Primary    Chronic systolic heart failure (HCC)    Relevant Medications    Losartan (COZAAR) 50 mg Tablet    PVD (peripheral vascular disease) (HCC)       Other    Mitral valve replaced    S/P CABG (coronary artery bypass graft)    Chronic anticoagulation    ICD  (implantable cardioverter-defibrillator) in place          Assessment and Plan:  This is a pleasant 59yr male with history as noted above.      Coronary disease: Symptomatically stable.  No progressive anginal symptoms.  Continue current medications.  Add carvedilol 6.25 mg twice daily for blood pressure control.    Hypertension: Elevated blood pressure.  Start carvedilol.    Hyperlipidemia: Continue current medications per    Heart failure: Continue current medications.  Start carvedilol.    ICD: Ventricular lead was not capturing.  Program was modified with good capture after programming changes.  Currently, normally functioning biventricular ICD.    Sincerely,    Shalla Bulluck R. Aubery Lapping, MD, [facility], [facility]  [facility]

## 2017-09-25 DIAGNOSIS — C61 Malignant neoplasm of prostate: Secondary | ICD-10-CM | POA: Diagnosis not present

## 2017-09-25 DIAGNOSIS — N5201 Erectile dysfunction due to arterial insufficiency: Secondary | ICD-10-CM | POA: Diagnosis not present

## 2017-10-07 DIAGNOSIS — R7301 Impaired fasting glucose: Secondary | ICD-10-CM | POA: Diagnosis not present

## 2017-10-07 DIAGNOSIS — C61 Malignant neoplasm of prostate: Secondary | ICD-10-CM | POA: Diagnosis not present

## 2017-10-07 DIAGNOSIS — N2889 Other specified disorders of kidney and ureter: Secondary | ICD-10-CM | POA: Diagnosis not present

## 2017-10-07 DIAGNOSIS — E782 Mixed hyperlipidemia: Secondary | ICD-10-CM | POA: Diagnosis not present

## 2017-10-07 DIAGNOSIS — Z8546 Personal history of malignant neoplasm of prostate: Secondary | ICD-10-CM | POA: Diagnosis not present

## 2017-10-07 DIAGNOSIS — D126 Benign neoplasm of colon, unspecified: Secondary | ICD-10-CM | POA: Diagnosis not present

## 2017-10-07 DIAGNOSIS — I10 Essential (primary) hypertension: Secondary | ICD-10-CM | POA: Diagnosis not present

## 2017-10-10 DIAGNOSIS — I10 Essential (primary) hypertension: Secondary | ICD-10-CM | POA: Diagnosis not present

## 2017-10-10 DIAGNOSIS — Z8546 Personal history of malignant neoplasm of prostate: Secondary | ICD-10-CM | POA: Diagnosis not present

## 2017-10-10 DIAGNOSIS — R7301 Impaired fasting glucose: Secondary | ICD-10-CM | POA: Diagnosis not present

## 2017-10-10 DIAGNOSIS — Z947 Corneal transplant status: Secondary | ICD-10-CM | POA: Diagnosis not present

## 2017-10-10 DIAGNOSIS — E782 Mixed hyperlipidemia: Secondary | ICD-10-CM | POA: Diagnosis not present

## 2017-10-10 DIAGNOSIS — Z6831 Body mass index (BMI) 31.0-31.9, adult: Secondary | ICD-10-CM | POA: Diagnosis not present

## 2017-10-10 DIAGNOSIS — D126 Benign neoplasm of colon, unspecified: Secondary | ICD-10-CM | POA: Diagnosis not present

## 2017-10-21 DIAGNOSIS — H25813 Combined forms of age-related cataract, bilateral: Secondary | ICD-10-CM | POA: Diagnosis not present

## 2017-10-21 DIAGNOSIS — H17813 Minor opacity of cornea, bilateral: Secondary | ICD-10-CM | POA: Diagnosis not present

## 2017-10-21 DIAGNOSIS — H53001 Unspecified amblyopia, right eye: Secondary | ICD-10-CM | POA: Diagnosis not present

## 2017-10-21 DIAGNOSIS — Z947 Corneal transplant status: Secondary | ICD-10-CM | POA: Diagnosis not present

## 2017-11-15 ENCOUNTER — Other Ambulatory Visit (HOSPITAL_BASED_OUTPATIENT_CLINIC_OR_DEPARTMENT_OTHER): Payer: Self-pay | Admitting: Cardiovascular Disease

## 2017-11-15 MED ORDER — ASPIRIN 81 MG TABLET,DELAYED RELEASE: 81.0000 mg | DELAYED_RELEASE_TABLET | Freq: Every day | ORAL | 0 refills | Status: DC

## 2017-11-15 MED ORDER — SACUBITRIL 97 MG-VALSARTAN 103 MG TABLET
1.00 | ORAL_TABLET | Freq: Two times a day (BID) | ORAL | 11 refills | Status: DC
Start: 2017-11-15 — End: 2018-09-26

## 2017-11-15 NOTE — Telephone Encounter (Signed)
Lov 09/12/17  No f/u scheduled    Deb, can you please send

## 2017-12-26 DIAGNOSIS — R69 Illness, unspecified: Secondary | ICD-10-CM | POA: Diagnosis not present

## 2018-03-07 DIAGNOSIS — Z23 Encounter for immunization: Secondary | ICD-10-CM | POA: Diagnosis not present

## 2018-04-15 DIAGNOSIS — S0502XA Injury of conjunctiva and corneal abrasion without foreign body, left eye, initial encounter: Secondary | ICD-10-CM | POA: Diagnosis not present

## 2018-04-15 DIAGNOSIS — X58XXXA Exposure to other specified factors, initial encounter: Secondary | ICD-10-CM | POA: Diagnosis not present

## 2018-04-15 DIAGNOSIS — Z947 Corneal transplant status: Secondary | ICD-10-CM | POA: Diagnosis not present

## 2018-04-15 DIAGNOSIS — H53001 Unspecified amblyopia, right eye: Secondary | ICD-10-CM | POA: Diagnosis not present

## 2018-04-15 DIAGNOSIS — H25813 Combined forms of age-related cataract, bilateral: Secondary | ICD-10-CM | POA: Diagnosis not present

## 2018-04-21 DIAGNOSIS — H52223 Regular astigmatism, bilateral: Secondary | ICD-10-CM | POA: Diagnosis not present

## 2018-04-21 DIAGNOSIS — C61 Malignant neoplasm of prostate: Secondary | ICD-10-CM | POA: Diagnosis not present

## 2018-04-21 DIAGNOSIS — H524 Presbyopia: Secondary | ICD-10-CM | POA: Diagnosis not present

## 2018-04-22 DIAGNOSIS — N2889 Other specified disorders of kidney and ureter: Secondary | ICD-10-CM | POA: Diagnosis not present

## 2018-04-22 DIAGNOSIS — R7301 Impaired fasting glucose: Secondary | ICD-10-CM | POA: Diagnosis not present

## 2018-04-22 DIAGNOSIS — E782 Mixed hyperlipidemia: Secondary | ICD-10-CM | POA: Diagnosis not present

## 2018-04-22 DIAGNOSIS — I10 Essential (primary) hypertension: Secondary | ICD-10-CM | POA: Diagnosis not present

## 2018-04-22 DIAGNOSIS — E559 Vitamin D deficiency, unspecified: Secondary | ICD-10-CM | POA: Diagnosis not present

## 2018-04-22 DIAGNOSIS — C61 Malignant neoplasm of prostate: Secondary | ICD-10-CM | POA: Diagnosis not present

## 2018-04-24 DIAGNOSIS — Z0001 Encounter for general adult medical examination with abnormal findings: Secondary | ICD-10-CM | POA: Diagnosis not present

## 2018-04-24 DIAGNOSIS — I10 Essential (primary) hypertension: Secondary | ICD-10-CM | POA: Diagnosis not present

## 2018-04-24 DIAGNOSIS — Z8546 Personal history of malignant neoplasm of prostate: Secondary | ICD-10-CM | POA: Diagnosis not present

## 2018-04-24 DIAGNOSIS — Z23 Encounter for immunization: Secondary | ICD-10-CM | POA: Diagnosis not present

## 2018-04-24 DIAGNOSIS — Z6831 Body mass index (BMI) 31.0-31.9, adult: Secondary | ICD-10-CM | POA: Diagnosis not present

## 2018-04-24 DIAGNOSIS — D126 Benign neoplasm of colon, unspecified: Secondary | ICD-10-CM | POA: Diagnosis not present

## 2018-04-24 DIAGNOSIS — Z87891 Personal history of nicotine dependence: Secondary | ICD-10-CM | POA: Diagnosis not present

## 2018-04-24 DIAGNOSIS — Z947 Corneal transplant status: Secondary | ICD-10-CM | POA: Diagnosis not present

## 2018-04-30 DIAGNOSIS — N5201 Erectile dysfunction due to arterial insufficiency: Secondary | ICD-10-CM | POA: Diagnosis not present

## 2018-04-30 DIAGNOSIS — C61 Malignant neoplasm of prostate: Secondary | ICD-10-CM | POA: Diagnosis not present

## 2018-06-18 DIAGNOSIS — L570 Actinic keratosis: Secondary | ICD-10-CM | POA: Diagnosis not present

## 2018-06-18 DIAGNOSIS — X32XXXA Exposure to sunlight, initial encounter: Secondary | ICD-10-CM | POA: Diagnosis not present

## 2018-07-14 NOTE — Progress Notes (Deleted)
Bryan Martinez  MRN:  [id number]  DOB: 1950/07/12      SUBJECTIVE  ICD check  Aubery Lapping  No recent labwork    Bryan Martinez returns to the office for a routine 6 month cardiology follow up visit. He has a history of Ischemic coronary disease status post coronary bypass grafting, chronic systolic heart failure, hypertension, hyperlipidemia, ICD.    He continues to feel well from a cardiac perspective, no complaints.  His functional and exertional capacity remains stable.  No exertional chest pain, SOB/PND, or edema.    PAST MEDICAL HISTORY  Past Medical History:   Diagnosis Date    Carotid stenosis     Chronic anticoagulation     Chronic systolic heart failure (HCC)     COPD (chronic obstructive pulmonary disease) (HCC)     Coronary artery disease     ICD (implantable cardioverter-defibrillator) in place 11/03/2015    Ischemic cardiomyopathy     Mitral valve replaced     PVD (peripheral vascular disease) (HCC)     S/P CABG (coronary artery bypass graft) 11/18/2013       SOCIAL HISTORY  Social History     Tobacco Use   Smoking Status Light Tobacco Smoker    Packs/day: 0.12    Types: Cigarettes   Smokeless Tobacco Current User   Tobacco Comment    pt has information to quit "thinking about the patches not sure with doc if go well with my drugs or not"     Social History     Substance and Sexual Activity   Alcohol Use No       ALLERGIES:    Penicillins    Hives    CURRENT MEDS:  Current Outpatient Medications on File Prior to Visit   Medication Sig Dispense Refill    Albuterol (PROAIR HFA) 90 mcg/actuation inhaler Take 1-2 puffs by inhalation every 4 to 6 hours if needed for wheezing. 8.5 g 0    Aspirin 81 mg EC Tablet Take 1 tablet by mouth every morning. 30 tablet 0    Atorvastatin (LIPITOR) 20 mg Tablet Take 1 tablet by mouth every day at bedtime. 90 tablet 3    Carvedilol (COREG) 6.25 mg Tablet Take 1 tablet by mouth 2 times daily with meals. 60 tablet 11    DiphenhydrAMINE (BENADRYL) 25 mg Capsule  Take by mouth every 6 hours if needed.      Ibuprofen (MOTRIN) 800 mg Tablet Take 800 mg by mouth every 8 hours if needed for pain.                 Omeprazole (PRILOSEC) 20 mg Delayed Release Capsule Take 1 capsule by mouth every morning. 90 capsule 3    Sacubitril 97 mg/Valsartan 103 mg (ENTRESTO) 97-103 mg Tablet Take 1 tablet by mouth 2 times daily. 60 tablet 11     No current facility-administered medications on file prior to visit.          OBJECTIVE    Gastrointestinal General No change in bowel habits, no melena, no hematochezia, no nausea, vomiting or abdominal pain.   General/Constitutional  no fever or chills.   Respiratory See HPI no coughing, no wheezing.   Peripheral Vascular no edema.   Hematology/ Lymph General No bleeding or bruising, and no hematuria.   Cardiovascular See HPI No chest pain, SOB, syncope, presyncope, dizziness, lightheadedness, palpitations, racing heart beats, no orthopnea or PND.     ASSESSMENT   There were no vitals taken for  this visit.    GENERAL::Alert, appropriate, in no acute distress, pleasant, calm, breathing easily on room air, Neck: No JVD in seated position. CARDIOVASCULAR:: Regular rate and rhythm, normal S1 and S2 with no murmur or gallop. CHEST:: Lungs clear, respirations unlabored, no crackles or wheezes, normal excursion. SKIN:: Uncovered skin is pink warm dry and intact. EXTREMITIES:: No edema.     Data:    Lab Results   Lab Name Value Date/Time    WBC 8.1 07/29/2017 02:51 PM    HGB 14.7 07/29/2017 02:51 PM    HCT 43.4 07/29/2017 02:51 PM    PLT 225 07/29/2017 02:51 PM       Lab Results   Lab Name Value Date/Time    NA 137 07/29/2017 02:51 PM    K 3.9 07/29/2017 02:51 PM    CL 105 07/29/2017 02:51 PM    CO2 23 07/29/2017 02:51 PM    BUN 16 07/29/2017 02:51 PM    CR 0.98 07/29/2017 02:51 PM    GLU 139 (H) 07/29/2017 02:51 PM       Lab Results   Lab Name Value Date/Time    AST 22 07/29/2017 02:51 PM    ALT 13 07/29/2017 02:51 PM    ALP 78 07/29/2017 02:51 PM    TP  6.9 07/29/2017 02:51 PM     PLAN    Return in 1-2 weeks or sooner should the need arise. Plan of care discussed with Dr Aubery Lapping. All questions and concerns addressed. Total of 25 minutes spent with the patient with over 15 minutes spent in coordination and counseling of care as noted above.       Roslyn Smiling, FNP  [facility]

## 2018-07-22 ENCOUNTER — Encounter (HOSPITAL_BASED_OUTPATIENT_CLINIC_OR_DEPARTMENT_OTHER): Payer: Self-pay | Admitting: Nurse Practitioner

## 2018-09-26 ENCOUNTER — Other Ambulatory Visit (HOSPITAL_BASED_OUTPATIENT_CLINIC_OR_DEPARTMENT_OTHER): Payer: Self-pay | Admitting: Cardiovascular Disease

## 2018-09-26 NOTE — Telephone Encounter (Signed)
Can you please send in a refill for Entresto and Flowmax to [facility] in [place]. The patient is out of both medications.

## 2018-09-26 NOTE — Telephone Encounter (Signed)
Last seen 09/12/2017  No Show for 2/818/2020    Patient is requesting Entresto and Flomax.    Returned call to notify him we do not refill Flomax, but that is not the correct number    Prepared for [facility] in [place].    Prepared fo one month and three refills

## 2018-09-29 MED ORDER — ENTRESTO 97 MG-103 MG TABLET
1.00 | ORAL_TABLET | Freq: Two times a day (BID) | ORAL | 3 refills | Status: DC
Start: 2018-09-29 — End: 2018-10-10

## 2018-10-10 ENCOUNTER — Emergency Department
Admission: RE | Admit: 2018-10-10 | Discharge: 2018-10-10 | Disposition: A | Discharge status: Home / Self Care | Payer: Medicaid (Managed Care) | Attending: Emergency Medicine | Admitting: Emergency Medicine

## 2018-10-10 ENCOUNTER — Emergency Department: Payer: Medicaid (Managed Care)

## 2018-10-10 DIAGNOSIS — F17210 Nicotine dependence, cigarettes, uncomplicated: Secondary | ICD-10-CM | POA: Insufficient documentation

## 2018-10-10 DIAGNOSIS — Y92015 Private garage of single-family (private) house as the place of occurrence of the external cause: Secondary | ICD-10-CM | POA: Insufficient documentation

## 2018-10-10 DIAGNOSIS — S5011XA Contusion of right forearm, initial encounter: Secondary | ICD-10-CM | POA: Insufficient documentation

## 2018-10-10 DIAGNOSIS — Y9389 Activity, other specified: Secondary | ICD-10-CM | POA: Insufficient documentation

## 2018-10-10 DIAGNOSIS — T07XXXA Unspecified multiple injuries, initial encounter: Secondary | ICD-10-CM | POA: Insufficient documentation

## 2018-10-10 DIAGNOSIS — S51821A Laceration with foreign body of right forearm, initial encounter: Secondary | ICD-10-CM | POA: Insufficient documentation

## 2018-10-10 DIAGNOSIS — S50851A Superficial foreign body of right forearm, initial encounter: Secondary | ICD-10-CM | POA: Insufficient documentation

## 2018-10-10 DIAGNOSIS — Z760 Encounter for issue of repeat prescription: Secondary | ICD-10-CM | POA: Insufficient documentation

## 2018-10-10 DIAGNOSIS — W268XXA Contact with other sharp object(s), not elsewhere classified, initial encounter: Secondary | ICD-10-CM | POA: Insufficient documentation

## 2018-10-10 MED ORDER — ATORVASTATIN 20 MG TABLET: 20.0000 mg | ORAL_TABLET | Freq: Every day | ORAL | 0 refills | Status: DC

## 2018-10-10 MED ORDER — OMEPRAZOLE 20 MG CAPSULE,DELAYED RELEASE: 20.0000 mg | DELAYED_RELEASE_CAPSULE | Freq: Every day | ORAL | 0 refills | Status: AC

## 2018-10-10 MED ORDER — ALBUTEROL SULFATE HFA 90 MCG/ACTUATION AEROSOL INHALER: 1.0000 | INHALATION_SPRAY | RESPIRATORY_TRACT | 0 refills | Status: DC | PRN

## 2018-10-10 MED ORDER — ENTRESTO 97 MG-103 MG TABLET
1.00 | ORAL_TABLET | Freq: Two times a day (BID) | ORAL | 0 refills | Status: AC
Start: 2018-10-10 — End: 2018-11-09

## 2018-10-10 MED ORDER — CARVEDILOL 6.25 MG TABLET: 6.2500 mg | ORAL_TABLET | Freq: Two times a day (BID) | ORAL | 0 refills | Status: DC

## 2018-10-10 NOTE — ED Provider Notes (Addendum)
Chief Complaint   Patient presents with    Wound Care     HPI: Bryan Martinez is a 89yr male who presents with multiple lacerations to right arm that occurred yesterday.  Patient states he was fixing the garage door spring when it shattered and pieces him hit him in the right arm.  Patient states his swelling to the right arm he is concerned he might be a break. Denies fever, nausea, vomiting, diarrhea, headache, dyspnea, chest pain, abdominal pain, edema.     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       Past Surgical History:   Procedure Laterality Date    CABG UNSPEC # VESSELS - HX ONLY  11/18/2013    HC BI-V ICD DEVICE  11/03/2015       Family History   Problem Relation Name Age of Onset    Heart Disease Mother      Heart Disease Father      Other (rhematoid arthritis) Sister      Cancer Brother          prostate       Social History     Socioeconomic History    Marital status: SINGLE     Spouse name: Not on file    Number of children: Not on file    Years of education: Not on file    Highest education level: Not on file   Occupational History    Not on file   Social Needs    Financial resource strain: Not on file    Food insecurity     Worry: Not on file     Inability: Not on file    Transportation needs     Medical: Not on file     Non-medical: Not on file   Tobacco Use    Smoking status: Light Tobacco Smoker      Packs/day: 0.12     Types: Cigarettes    Smokeless tobacco: Current User    Tobacco comment: pt has information to quit "thinking about the patches not sure with doc if go well with my drugs or not"   Substance and Sexual Activity    Alcohol use: No    Drug use: No    Sexual activity: Not on file   Lifestyle    Physical activity     Days per week: Not on file     Minutes per session: Not on file    Stress: Not on file   Relationships    Social connections     Talks on phone: Not on file     Gets together: Not on file     Attends religious service: Not on file     Active member of club or organization: Not on file     Attends meetings of clubs or organizations: Not on file     Relationship status: Not on file  Intimate partner violence     Fear of current or ex partner: Not on file     Emotionally abused: Not on file     Physically abused: Not on file     Forced sexual activity: Not on file   Other Topics Concern    Not on file   Social History Narrative    Not on file       Allergies:   Penicillins    Hives    Medication: No current facility-administered medications for this encounter.     Current Outpatient Medications:     Albuterol (PROAIR HFA) 90 mcg/actuation inhaler, Take 1-2 puffs by inhalation every 4 to 6 hours if needed for wheezing., Disp: 8.5 g, Rfl: 0    Aspirin 81 mg EC Tablet, Take 1 tablet by mouth every morning., Disp: 30 tablet, Rfl: 0    Atorvastatin (LIPITOR) 20 mg Tablet, Take 1 tablet by mouth every day at bedtime., Disp: 30 tablet, Rfl: 0    Carvedilol (COREG) 6.25 mg Tablet, Take 1 tablet by mouth 2 times daily with meals., Disp: 60 tablet, Rfl: 0    DiphenhydrAMINE (BENADRYL) 25 mg Capsule, Take by mouth every 6 hours if needed., Disp: , Rfl:     Ibuprofen (MOTRIN) 800 mg Tablet, Take 800 mg by mouth every 8 hours if needed for pain.       , Disp: , Rfl:     Omeprazole (PRILOSEC) 20 mg Delayed Release Capsule, Take 1 capsule by mouth every morning., Disp: 30 capsule, Rfl:  0    Sacubitril 97 mg/Valsartan 103 mg (ENTRESTO) Tablet, Take 1 tablet by mouth 2 times daily., Disp: 60 tablet, Rfl: 0    ROS:  Review of Systems   Constitutional: Negative for activity change, chills, diaphoresis, fatigue, fever and unexpected weight change.   HENT: Negative for congestion, ear discharge, ear pain, facial swelling, mouth sores, nosebleeds, postnasal drip, rhinorrhea, sinus pressure, sneezing and sore throat.    Eyes: Negative for pain, discharge, redness, itching and visual disturbance.   Respiratory: Negative for cough, choking, chest tightness, shortness of breath, wheezing and stridor.    Cardiovascular: Negative for chest pain, palpitations and leg swelling.   Gastrointestinal: Negative for abdominal distention, abdominal pain, blood in stool, constipation, diarrhea, nausea, rectal pain and vomiting.   Endocrine: Negative for polydipsia, polyphagia and polyuria.   Genitourinary: Negative for decreased urine volume, dysuria, enuresis, flank pain, frequency, hematuria and urgency.   Musculoskeletal: Positive for joint swelling. Negative for arthralgias, back pain, gait problem, myalgias and neck pain.   Skin: Positive for wound. Negative for color change, pallor and rash.   Allergic/Immunologic: Negative for environmental allergies, food allergies and immunocompromised state.   Neurological: Negative for dizziness, tremors, syncope, light-headedness, numbness and headaches.   Hematological: Negative for adenopathy. Does not bruise/bleed easily.   Psychiatric/Behavioral: Negative for agitation, confusion, hallucinations, self-injury, sleep disturbance and suicidal ideas. The patient is not nervous/anxious.        PE:  Physical Exam  Vitals signs and nursing note reviewed.   Constitutional:       General: He is not in acute distress.     Appearance: He  is well-developed. He is not diaphoretic.   HENT:      Head: Normocephalic and atraumatic.      Right Ear: External ear normal.      Left Ear: External ear normal.      Nose: Nose normal.      Mouth/Throat:  Mouth: Mucous membranes are moist.      Pharynx: Oropharynx is clear. No oropharyngeal exudate.   Eyes:      General:         Right eye: No discharge.         Left eye: No discharge.      Pupils: Pupils are equal, round, and reactive to light.   Neck:      Musculoskeletal: Normal range of motion and neck supple.      Thyroid: No thyromegaly.      Trachea: No tracheal deviation.   Cardiovascular:      Rate and Rhythm: Normal rate and regular rhythm.      Heart sounds: Normal heart sounds. No murmur. No friction rub. No gallop.    Pulmonary:      Effort: Pulmonary effort is normal. No respiratory distress.      Breath sounds: Normal breath sounds. No stridor. No wheezing or rales.   Chest:      Chest wall: No tenderness.   Abdominal:      General: Bowel sounds are normal. There is no distension.      Palpations: Abdomen is soft. There is no mass.      Tenderness: There is no abdominal tenderness. There is no guarding or rebound.   Musculoskeletal: Normal range of motion.         General: Swelling, tenderness and signs of injury present. No deformity.      Right forearm: He exhibits tenderness, swelling and laceration. He exhibits no bony tenderness, no edema and no deformity.        Arms:    Lymphadenopathy:      Cervical: No cervical adenopathy.   Skin:     General: Skin is warm and dry.      Capillary Refill: Capillary refill takes less than 2 seconds.      Coloration: Skin is not pale.      Findings: No erythema or rash.   Neurological:      General: No focal deficit present.      Mental Status: He is alert and oriented to person, place, and time.      Cranial Nerves: No cranial nerve deficit.      Motor: No weakness.      Coordination: Coordination normal.      Deep Tendon Reflexes: Reflexes normal.    Psychiatric:         Mood and Affect: Mood normal.         Behavior: Behavior normal.         Temp: 36.1 C (97 F) (05/08 1711)  Temp src: --  Pulse: 103 (05/08 1711)  BP: 136/100 (05/08 1711)  Resp: 18 (05/08 1711)  SpO2: 96 % (05/08 1711)  Height: 172.7 cm (5\' 8" ) (05/08 1711)  Weight: 68 kg (150 lb) (05/08 1711)    Differentials: Fracture, contusion, laceration, sprain    Labs: No results found for this visit on 10/10/18.    Radiology:None    Orders Placed This Encounter    [facility] WRIST 3 OR MORE VIEWS RIGHT    Atorvastatin (LIPITOR) 20 mg Tablet    Albuterol (PROAIR HFA) 90 mcg/actuation inhaler    Carvedilol (COREG) 6.25 mg Tablet    Omeprazole (PRILOSEC) 20 mg Delayed Release Capsule    Sacubitril 97 mg/Valsartan 103 mg (ENTRESTO) Tablet       MDM  Reviewed: nursing note and vitals        ED Summary: Bryan Martinez  is a 80yr male with laceration and swelling to right forearm. Work up in the ED consisted of examination, imaging.  Examination reveals tenderness to the wrist as well as the distal forearm, there are multiple small lacerations all bleeding controlled, none need or require suturing at this time especially considering her 24 hours old.  Patient has good distal end, neuro, vascular, sensory, motor.  Patient had an x-ray done which was read as negative by stat radiology, however I noted that there were some very small foreign bodies likely metallic in the forearm but these were fairly deep and unable to be removed.  Patient states his last tetanus was 1 year ago.  Wounds were cleaned and dressed appropriately.  Patient also at the end of the visit requested refills of his medication for his cholesterol, hypertension and GERD.  Discussed the findings with the patient keep the wounds clean and covered and follow-up with primary care. I discussed the results with the patient along with likely diagnosis. Patient was advised  to follow with primary care in 2 days and return to the ED if any worsening swelling, redness,.    Procedures: None    Clinical Impression:     ICD-10-CM    1. Multiple lacerations T07.XXXA     Right forearm greater than 24 hours old   2. Contusion of right forearm, initial encounter S50.11XA    3. Foreign body in right forearm, initial encounter S50.851A    4. Medication refill Z76.0        PLAN: 89yr male findings along with testing likely consistent with right forearm contusion, multiple lacerations, medication refill for hypertension, hyperlipidemia and GERD. Follow up and return precautions discussed with the patient as above. The patient participated in the decision making process, verbalized understanding, and no barriers to learning identified.    The following medications were prescribed: None    Disposition: Discharge. Follow up with PCP. ED discharge instructions were reviewed and provided.    APP DOCUMENTATION:  I have evaluated this patient, Dr. Volney Presser was available for consultation.    PATIENT'S GENERAL CONDITION:  Good: Vital signs are stable and within normal limits. Patient is conscious and comfortable. Indicators are excellent. [place]    Though I suspect no acutely life threatening condition, I did recommend patient return for any new or worse problems. PT was advised to follow up with PCP for further evaluation as soon as reasonable. Patient is aware their evaluation was not complete and primarily acute issues were evaluated and there is still a possibility of chronic issues requiring further evaluation.     Comment: Please note this record has been produced using speech recognition system and may contain errors related to that system including errors in grammar, punctuation, and spelling, as well as words and phrases that may be inappropriate. If there are any questions or concerns please feel free to contact the dictating provider for clarification.    Electronically Signed by: Dennie Fetters,  PA-C

## 2018-10-10 NOTE — ED Triage Note (Signed)
Working on Geneticist, molecular. Door moved and lacerated right wrist. No bleeding. Bruising and swelling noted

## 2018-10-10 NOTE — ED Nursing Note (Signed)
Irrigated pt's rt arm abrasions and lacs with 500 ml of Betadine and NS solution. Pt tolerated well. Non-adherent dressing applied.

## 2018-10-10 NOTE — ED Nursing Note (Signed)
Wound dressed as instructed by MD by ER tech. Pt tolerated well, NAD. VSS. +CSM noted.

## 2018-10-10 NOTE — Discharge Instructions (Signed)
DC Instructions  Thank you for choosing [facility] for your emergency health care needs. It has been our privilege to take care of you today. Your primary complaints have been evaluated based on your history, lab tests, imaging tests and you have been treated for your symptoms and discharged home. Please take all medicines that are prescribed to you as directed (see below). It is crucial, if you have a primary care physician, to follow up with him or her in the time frame recommended as many health conditions that seem self-limited initially may actually worsen over time. If you do not have a primary care physician, we will outline the various resources available for you to find one.    If at any time you feel that your condition is worsening, call your doctor or return to the Emergency Department for reevaluation. CALL 911 IF YOU THINK YOU ARE HAVING A MEDICAL EMERGENCY. Return to the Emergency Department if you are unable to obtain the recommended follow-up treatment or you are not better as expected. You can call the Medical Center with questions at [phone].    Please realize that the results of some studies that you had done during your stay with [place] (such as x-rays and cultures) have only preliminarily results at this time. Results of these studies may change as more information becomes available or as the studies are re-evaluated by other members of our health care team in the next few days. We will attempt to contact you with any important changes or additions to the studies that were obtained today, particularly if any of these results require a change in your treatment.    Keep the wounds clean and covered, follow-up with your doctor in 2 days.  You may follow-up with a surgeon for the very small foreign bodies that are in your right forearm if they are causing you problems.  Return to the emergency department for worsening or concern.

## 2018-10-10 NOTE — ED Nursing Note (Signed)
Mike at bs

## 2018-10-10 NOTE — ED Nursing Note (Signed)
Pt re-evaled by ermd. Discharged with both written and verbal instructions per avs and contusion of right forearm info sheets. All home care discussed. VSS. Gait steady out of ER with spouse. Encouraged close f/u with PCP and to return for any worsening S/S.

## 2018-10-17 DIAGNOSIS — N2889 Other specified disorders of kidney and ureter: Secondary | ICD-10-CM | POA: Diagnosis not present

## 2018-10-17 DIAGNOSIS — I10 Essential (primary) hypertension: Secondary | ICD-10-CM | POA: Diagnosis not present

## 2018-10-17 DIAGNOSIS — E782 Mixed hyperlipidemia: Secondary | ICD-10-CM | POA: Diagnosis not present

## 2018-10-20 DIAGNOSIS — R7301 Impaired fasting glucose: Secondary | ICD-10-CM | POA: Diagnosis not present

## 2018-10-20 DIAGNOSIS — D126 Benign neoplasm of colon, unspecified: Secondary | ICD-10-CM | POA: Diagnosis not present

## 2018-10-20 DIAGNOSIS — Z87891 Personal history of nicotine dependence: Secondary | ICD-10-CM | POA: Diagnosis not present

## 2018-10-20 DIAGNOSIS — I10 Essential (primary) hypertension: Secondary | ICD-10-CM | POA: Diagnosis not present

## 2018-10-20 DIAGNOSIS — E782 Mixed hyperlipidemia: Secondary | ICD-10-CM | POA: Diagnosis not present

## 2018-10-20 DIAGNOSIS — Z6829 Body mass index (BMI) 29.0-29.9, adult: Secondary | ICD-10-CM | POA: Diagnosis not present

## 2018-10-20 DIAGNOSIS — C61 Malignant neoplasm of prostate: Secondary | ICD-10-CM | POA: Diagnosis not present

## 2018-10-24 DIAGNOSIS — C61 Malignant neoplasm of prostate: Secondary | ICD-10-CM | POA: Diagnosis not present

## 2018-10-31 DIAGNOSIS — N5201 Erectile dysfunction due to arterial insufficiency: Secondary | ICD-10-CM | POA: Diagnosis not present

## 2018-10-31 DIAGNOSIS — C61 Malignant neoplasm of prostate: Secondary | ICD-10-CM | POA: Diagnosis not present

## 2018-11-03 DIAGNOSIS — Z947 Corneal transplant status: Secondary | ICD-10-CM | POA: Diagnosis not present

## 2018-11-03 DIAGNOSIS — H25813 Combined forms of age-related cataract, bilateral: Secondary | ICD-10-CM | POA: Diagnosis not present

## 2018-11-03 DIAGNOSIS — H53001 Unspecified amblyopia, right eye: Secondary | ICD-10-CM | POA: Diagnosis not present

## 2018-11-03 DIAGNOSIS — H17813 Minor opacity of cornea, bilateral: Secondary | ICD-10-CM | POA: Diagnosis not present

## 2019-02-17 DIAGNOSIS — Z23 Encounter for immunization: Secondary | ICD-10-CM | POA: Diagnosis not present

## 2019-04-27 DIAGNOSIS — C61 Malignant neoplasm of prostate: Secondary | ICD-10-CM | POA: Diagnosis not present

## 2019-05-01 DIAGNOSIS — R7301 Impaired fasting glucose: Secondary | ICD-10-CM | POA: Diagnosis not present

## 2019-05-01 DIAGNOSIS — C61 Malignant neoplasm of prostate: Secondary | ICD-10-CM | POA: Diagnosis not present

## 2019-05-01 DIAGNOSIS — I10 Essential (primary) hypertension: Secondary | ICD-10-CM | POA: Diagnosis not present

## 2019-05-01 DIAGNOSIS — E559 Vitamin D deficiency, unspecified: Secondary | ICD-10-CM | POA: Diagnosis not present

## 2019-05-01 DIAGNOSIS — E782 Mixed hyperlipidemia: Secondary | ICD-10-CM | POA: Diagnosis not present

## 2019-05-04 DIAGNOSIS — R7301 Impaired fasting glucose: Secondary | ICD-10-CM | POA: Diagnosis not present

## 2019-05-04 DIAGNOSIS — Z0001 Encounter for general adult medical examination with abnormal findings: Secondary | ICD-10-CM | POA: Diagnosis not present

## 2019-05-04 DIAGNOSIS — C61 Malignant neoplasm of prostate: Secondary | ICD-10-CM | POA: Diagnosis not present

## 2019-05-04 DIAGNOSIS — Z947 Corneal transplant status: Secondary | ICD-10-CM | POA: Diagnosis not present

## 2019-05-04 DIAGNOSIS — D126 Benign neoplasm of colon, unspecified: Secondary | ICD-10-CM | POA: Diagnosis not present

## 2019-05-04 DIAGNOSIS — E782 Mixed hyperlipidemia: Secondary | ICD-10-CM | POA: Diagnosis not present

## 2019-05-04 DIAGNOSIS — Z6831 Body mass index (BMI) 31.0-31.9, adult: Secondary | ICD-10-CM | POA: Diagnosis not present

## 2019-05-04 DIAGNOSIS — I10 Essential (primary) hypertension: Secondary | ICD-10-CM | POA: Diagnosis not present

## 2019-05-08 DIAGNOSIS — N5201 Erectile dysfunction due to arterial insufficiency: Secondary | ICD-10-CM | POA: Diagnosis not present

## 2019-05-08 DIAGNOSIS — C61 Malignant neoplasm of prostate: Secondary | ICD-10-CM | POA: Diagnosis not present

## 2019-06-25 ENCOUNTER — Emergency Department
Admission: AD | Admit: 2019-06-25 | Discharge: 2019-06-25 | Disposition: A | Discharge status: Home / Self Care | Payer: Medicare Other | Attending: Emergency Medicine | Admitting: Emergency Medicine

## 2019-06-25 ENCOUNTER — Emergency Department: Payer: Medicare Other

## 2019-06-25 DIAGNOSIS — L03317 Cellulitis of buttock: Secondary | ICD-10-CM | POA: Insufficient documentation

## 2019-06-25 DIAGNOSIS — L0231 Cutaneous abscess of buttock: Secondary | ICD-10-CM | POA: Insufficient documentation

## 2019-06-25 LAB — BASIC METABOLIC PANEL
BUN/ Creatinine: 24.8 — ABNORMAL HIGH (ref 7.3–21.7)
Calcium: 8.1 mg/dL — ABNORMAL LOW (ref 8.7–10.2)
Carbon Dioxide Total: 25 mmol/L (ref 22–32)
Chloride: 102 mmol/L (ref 99–109)
Creatinine Serum: 1.57 mg/dL — ABNORMAL HIGH (ref 0.50–1.30)
E-GFR, Non-African American: 45 mL/min/{1.73_m2} — ABNORMAL LOW (ref >60)
E-GFR: 52 mL/min/{1.73_m2} — ABNORMAL LOW (ref >60)
Glucose: 113 mg/dL — ABNORMAL HIGH (ref 70–99)
Potassium: 3.8 mmol/L (ref 3.5–5.2)
Sodium: 137 mmol/L (ref 134–143)
Urea Nitrogen, Blood (BUN): 39 mg/dL — ABNORMAL HIGH (ref 6–21)

## 2019-06-25 LAB — CBC WITH DIFFERENTIAL
Basophils % Auto: 0.8 % (ref 0.0–1.0)
Basophils Abs Auto: 0.1 10*3/uL (ref 0.0–0.1)
Eosinophils % Auto: 5 % — ABNORMAL HIGH (ref 0.0–4.0)
Eosinophils Abs Auto: 0.5 10*3/uL — ABNORMAL HIGH (ref 0.0–0.2)
Hematocrit: 42.1 % (ref 38.0–51.0)
Hemoglobin: 13.6 g/dL (ref 13.0–18.0)
Immature Granulocytes % Auto: 3.7 % — ABNORMAL HIGH (ref 0.00–0.50)
Immature Granulocytes Abs Auto: 0.4 10*3/uL — ABNORMAL HIGH (ref 0.0–0.0)
Lymphocytes % Auto: 18.7 % (ref 5.0–41.0)
Lymphocytes Abs Auto: 1.9 10*3/uL (ref 1.3–2.9)
MCH: 28.4 pg (ref 27.0–34.0)
MCHC g/dL: 32.3 g/dL — ABNORMAL LOW (ref 33.0–37.0)
MCV: 87.9 fL (ref 82.0–99.0)
MPV: 9.5 fL (ref 9.4–12.4)
Monocytes % Auto: 7.3 % (ref 0.0–10.0)
Monocytes Abs Auto: 0.7 10*3/uL (ref 0.3–0.8)
Neutrophils % Auto: 64.5 % (ref 45.0–75.0)
Neutrophils Abs Auto: 6.52 10*3/uL — ABNORMAL HIGH (ref 2.20–4.80)
Nucleated Cell Count: 0 10*3/uL (ref 0.0–0.1)
Nucleated RBC/100 WBC: 0 % WBC (ref <=0.0)
Platelet Count: 304 10*3/uL (ref 151–365)
RDW: 15.6 % — ABNORMAL HIGH (ref 11.5–14.5)
Red Blood Cell Count: 4.79 10*6/uL (ref 4.10–5.65)
White Blood Cell Count: 10.1 10*3/uL (ref 4.2–10.8)

## 2019-06-25 LAB — CREATINE KINASE: Creatine Kinase: 60 U/L (ref 35–232)

## 2019-06-25 MED ORDER — CLINDAMYCIN 600 MG/50 ML IN 5 % DEXTROSE INTRAVENOUS PIGGYBACK
600.00 mg | INJECTION | Freq: Once | INTRAVENOUS | Status: AC
Start: 2019-06-25 — End: 2019-06-25
  Administered 2019-06-25: 04:00:00 600 mg via INTRAVENOUS
  Filled 2019-06-25: qty 50

## 2019-06-25 MED ORDER — NACL 0.9% IV BOLUS - DURATION REQ
1000.00 mL | Freq: Once | INTRAVENOUS | Status: AC
Start: 2019-06-25 — End: 2019-06-25
  Administered 2019-06-25: 05:00:00 1000 mL via INTRAVENOUS

## 2019-06-25 MED ORDER — IOPAMIDOL 370 MG IODINE/ML (76 %) INTRAVENOUS SOLUTION
90.00 mL | INTRAVENOUS | Status: AC
Start: 2019-06-25 — End: 2019-06-25
  Administered 2019-06-25: 04:00:00 90 mL via INTRAVENOUS

## 2019-06-25 MED ORDER — CLINDAMYCIN HCL 150 MG CAPSULE
300.00 mg | ORAL_CAPSULE | Freq: Four times a day (QID) | ORAL | 0 refills | Status: AC
Start: 2019-06-25 — End: 2019-07-05

## 2019-06-25 NOTE — Discharge Instructions (Signed)
Take the antibiotics as prescribed.  This abscess will probably continue to drain, which is okay.  If you notice that the area gets hard again or there is worsening redness or increased drainage or you develop a fever, it is very important that she return to the emergency department

## 2019-06-25 NOTE — ED Triage Note (Signed)
Patient c injury to L glute x 3 weeks ago.  I&D via self yesterday and now with bloody and yellow drainage.  Denies fever and chills

## 2019-06-25 NOTE — ED Nursing Note (Signed)
Left buttocks examined by Dr. Joselyn Arrow and myself, the area where the patient took an exacto knife yesterday to the area could be clearly seen. Dr. Joselyn Arrow pressed on the area slightly and when doing so caused puss like fluid to come out of the area. Patient to get a CT of the area.

## 2019-06-25 NOTE — ED Provider Notes (Signed)
History obtained from Self    Chief Complaint:  Abscess (L gluteal, x 3 weeks, c drainage)      History of Present Illness:  Mr. Bryan Martinez is a 30yr male who presents to the Emergency Department complaining of left buttock abscess.  The patient states that about 3 weeks ago, he sat down on a chair and felt a sharp poke in his left buttock.  He did not see any foreign body and did not see anything particularly sharp on the chair itself.  Since then he has had progressively worsening tenderness and swelling of the left buttock.  Today it was at the point where he could not sit on the area.  About 24 hours ago, he used an X-Acto knife to poke a small hole in the superolateral aspect of the swelling and a significant amount of purulent bloody drainage was expressed.  He denies any fever or nausea.  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     Current Outpatient Medications:     Albuterol (PROAIR HFA) 90 mcg/actuation inhaler, Take 1-2 puffs by inhalation every 4 to 6 hours if needed for wheezing., Disp: 8.5 g, Rfl: 0    Aspirin 81 mg EC Tablet,  Take 1 tablet by mouth every morning., Disp: 30 tablet, Rfl: 0    Atorvastatin (LIPITOR) 20 mg Tablet, Take 1 tablet by mouth every day at bedtime., Disp: 30 tablet, Rfl: 0    Carvedilol (COREG) 6.25 mg Tablet, Take 1 tablet by mouth 2 times daily with meals., Disp: 60 tablet, Rfl: 0    DiphenhydrAMINE (BENADRYL) 25 mg Capsule, Take by mouth every 6 hours if needed., Disp: , Rfl:     Ibuprofen (MOTRIN) 800 mg Tablet, Take 800 mg by mouth every 8 hours if needed for pain.       , Disp: , Rfl:     Omeprazole (PRILOSEC) 20 mg Delayed Release Capsule, Take 1 capsule by mouth every morning., Disp: 30 capsule, Rfl: 0    Allergies  Penicillins    Physical Exam:  Vital signs reviewed.  Nursing notes reviewed.  Vitals:  BP 157/82   Pulse 78   Temp 36.4 C (97.6 F) (Oral)   Resp 20   Ht 1.727 m (5\' 8" )   Wt 68 kg (150 lb)   SpO2 92%   BMI 22.81 kg/m   O2 sat is normal on room air  Constitutional:  The patient is well appearing and is in no acute distress  Eyes:  Anicteric, EOM intact  ENT:  Hearing grossly normal, ext. Ears and nose normal  Neck:  Supple, no visible thyromegaly  Chest:  Equal expansion  Resp:   normal effort  Back: Left buttock: 12 to 15 cm circular induration on the mid buttock, there are palpable areas of fluctuance, mild erythema and warmth, there is a less than 1 cm incision in the superolateral aspect, with pressure, purulent drainage is expressed from that wound  Extremities:   Well perfused  Neuro:  Speech normal, face symmetric, moving all four extremities  Skin: See back exam  Psych:  Alert and oriented    Results of Diagnostic Studies  Review of other past medical records and past encounters in Epic completed    Review of labs:  Results for orders placed or performed during the hospital encounter of 06/25/19   CBC with Differential     Status: Abnormal   Result Value Status    White Blood Cell Count 10.1 Final    Red Blood Cell Count 4.79 Final    Hemoglobin 13.6 Final    Hematocrit  42.1 Final    MCV 87.9 Final    MCH 28.4 Final    MCHC g/dL 25.3 (L) Final    RDW 66.4 (H) Final    MPV 9.5 Final    Platelet Count 304 Final    Neutrophils % Auto 64.5 Final    Lymphocytes % Auto 18.7 Final    Monocytes % Auto 7.3 Final    Eosinophils % Auto 5.0 (H) Final    Basophils % Auto 0.8 Final    Immature Granulocytes % Auto 3.70 (H) Final    Neutrophils Abs Auto 6.52 (H) Final    Lymphocytes Abs Auto 1.9 Final    Monocytes Abs Auto 0.7 Final    Eosinophils Abs Auto 0.5 (H) Final    Basophils Abs Auto 0.1 Final    Nucleated RBC/100 WBC 0.0 Final    Nucleated Cell Count 0.0 Final    Immature Granulocytes Abs Auto 0.4 (H) Final   Basic Metabolic Panel     Status: Abnormal   Result Value Status    Sodium 137 Final    Potassium 3.8 Final    Chloride 102 Final    Carbon Dioxide Total 25 Final    Urea Nitrogen, Blood (BUN) 39 (H) Final    Creatinine Serum 1.57 (H) Final    BUN/ Creatinine 24.8 (H) Final    Glucose 113 (H) Final    Calcium 8.1 (L) Final    E-GFR 52 (L) Final    E-GFR, Non-African American 45 (L) Final     Review of Radiology results:   CT pelvis: Subcutaneous emphysema superficial to the left gluteal muscle with surrounding hyperenhancing fat stranding.  Findings suggest a fistulous tract into the subcutaneous tissues.  There is some regional edema suggested.  No rim-enhancing abscess identified however.  The superficial margin of the gluteal muscle somewhat hyperenhancing suggesting a component of myositis.  However no intramuscular fluid collection is identified.  Asymmetric enhancing left inguinal nodes are presumed reactive.  No significant intraperitoneal abnormality.    INDEPENDENT INTERPRETATION:  I have reviewed and independently interpreted the above labs, imaging and/or EKGs    Patient was notified that radiology results may be preliminary and are subject to further review by radiology staff.  If any significant changes are made to preliminary interpretations, the patient will be  notified if clinically significant.    ED Course/Medical Decision Making  Patient  presents with 3 weeks of progressively worsening left buttock swelling.  He performed an I&D on himself yesterday and expressed a significant amount of purulent material.  He appears nontoxic, specifically he is not febrile, tachycardic or hypotensive.  He has no leukocytosis.  A CT done showed pretty extensive cellulitis but no drainable deep abscess.  I discussed the case with the radiologist about the possibility of myositis but he states if anything it is very very superficial and by no means extends deep into the muscle.  At this point, I am going to recommend that the patient continue supportive care at home but we will go ahead and add antibiotics.  Strict return precautions were discussed.  Stable for discharge at this time    Final Impression  Left buttock abscess    Disposition  Patient stable and improved at the time of disposition.  Instructions provided.  Prescriptions given for clindamycin.  Patient and/or caregiver understand and agree with this plan. Please see ED AVS for details on discharge instructions.

## 2019-06-25 NOTE — ED Nursing Note (Signed)
Patient discharged from ED with AVS, Rx, related instructions and all belongings. Patient is in NAD, is awake/alert skin, is pink/warm/dry. Pt leaves the ED stable. Recap, patient had lab work done, IV, CT, fluids and antibiotics for an abscess to the LT buttocks.  ambulatory with self.

## 2019-08-18 ENCOUNTER — Telehealth (HOSPITAL_BASED_OUTPATIENT_CLINIC_OR_DEPARTMENT_OTHER): Payer: Self-pay | Admitting: Cardiovascular Disease

## 2019-08-18 NOTE — Telephone Encounter (Signed)
Lov 09/12/17  No showed 07/22/18  No appt scheduled    [facility] asking for refill of carvedilol 6.25mg  BID    Faxed back to [facility] stating pt needs an appt for refills.

## 2019-09-03 ENCOUNTER — Telehealth (HOSPITAL_BASED_OUTPATIENT_CLINIC_OR_DEPARTMENT_OTHER): Payer: Self-pay | Admitting: Cardiovascular Disease

## 2019-09-03 NOTE — Telephone Encounter (Signed)
Bryan Martinez from [employer] [facility] (phone: [phone], option 1) called to ask if we received a fax this morning containing a lab order for genetic testing? Thank you

## 2019-09-03 NOTE — Telephone Encounter (Signed)
Leaving in fax inbox for Santina Evans to address.

## 2019-09-03 NOTE — Telephone Encounter (Signed)
Spoke with [facility], did not receive, they will send again.

## 2019-09-07 NOTE — Telephone Encounter (Signed)
Received fax. Call patient to verify  info and pt's voicemail box is full.

## 2019-09-09 DIAGNOSIS — R69 Illness, unspecified: Secondary | ICD-10-CM | POA: Diagnosis not present

## 2019-09-10 NOTE — Telephone Encounter (Signed)
Attempted to call pt again. Voicemail box is full.  Want to ask the patient if this is something that he wants to do.

## 2019-09-11 NOTE — Telephone Encounter (Signed)
No answer.  Mailbox is full.

## 2019-09-15 NOTE — Telephone Encounter (Addendum)
lvm notifying pt.  [facility] was also notified.

## 2019-09-15 NOTE — Telephone Encounter (Signed)
Bryan Martinez with [facility] lvm asking for paperwork to be faxed to them with sections 5, 6 and 10 filled out. I called her back to let her know I need to speak with the patient first before I send them clinical notes. She gave me a new number [phone] (chart updated).

## 2019-09-15 NOTE — Telephone Encounter (Signed)
I called pt and he would like to know if Dr. Aubery Lapping thinks he should have genetic testing.

## 2019-09-15 NOTE — Telephone Encounter (Signed)
"  Wireless caller is not available. Please try again later." And then disconnected.    I need to talk to the patient to see if this "genetic testing" is something that he wants to do.

## 2019-09-15 NOTE — Telephone Encounter (Signed)
Genetic testing for what exactly?  There isn't anythig that I would be ordering genetic testing for right now.

## 2019-10-06 NOTE — Progress Notes (Signed)
Bryan Martinez  DOB: [birth year]  MR#: [id number]    SUBJECTIVE    Bryan Martinez returns to the office for an overdue cardiology visit, no seen in the office since 2019. He has been living with family on the [facility] over the past year. He has a history of ischemic coronary disease, systolic heart failure, hypertension, hyperlipidemia and ICD placement. Since the last visit, he has remained stable, no issues. He ran out of his medications a few days ago, including Entresto and carvedilol. His recent labwork showed slightly increased renal function, BUN 39, Cr 1.57. Denies exertional chest pain. No SOB. No palpitations. No PND/orthopnea. No edema. Functional/exertional capacity stable.      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       SOCIAL HISTORY  Social History     Tobacco Use   Smoking Status Light Tobacco Smoker    Packs/day: 0.12    Types: Cigarettes   Smokeless Tobacco Current User   Tobacco Comment    pt has information to quit "thinking about the patches not sure with doc if go well with my drugs or not"     Social History     Substance and Sexual Activity   Alcohol Use No       ALLERGIES:    Penicillins    Hives    CURRENT MEDS:      Current Outpatient Medications:     Albuterol (PROAIR HFA) 90 mcg/actuation inhaler, Take 1-2 puffs by inhalation every 4 to 6 hours if needed for wheezing., Disp: 8.5 g, Rfl: 0    Aspirin 81  mg EC Tablet, Take 1 tablet by mouth every morning., Disp: 30 tablet, Rfl: 0    Atorvastatin (LIPITOR) 20 mg Tablet, Take 1 tablet by mouth every day at bedtime., Disp: 30 tablet, Rfl: 0    Carvedilol (COREG) 6.25 mg Tablet, Take 1 tablet by mouth 2 times daily with meals., Disp: 60 tablet, Rfl: 0    ENTRESTO 97-103 mg Tablet, Take 1 tablet by mouth 2 times daily., Disp: , Rfl:     Omeprazole (PRILOSEC) 20 mg Delayed Release Capsule, Take 1 capsule by mouth every morning., Disp: 30 capsule, Rfl: 0    OBJECTIVE    Gastrointestinal General No change in bowel habits, no melena, no hematochezia, no nausea, vomiting or abdominal pain.   General/Constitutional  no fever or chills.   Respiratory See HPI no coughing, no wheezing.   Peripheral Vascular no edema.   Hematology/ Lymph General No bleeding or bruising, and no hematuria.   Cardiovascular See HPI No chest pain, SOB, syncope, presyncope, dizziness, lightheadedness, palpitations, racing heart beats, no orthopnea or PND.     ASSESSMENT     BP 152/78 (SITE: right arm, Orthostatic Position: sitting, Cuff Size: regular)   Pulse  87   Resp 12   Ht 1.727 m (5\' 8" )   Wt 64.9 kg (143 lb)   SpO2 95%   BMI 21.74 kg/m     GENERAL::Alert, appropriate, in no acute distress, pleasant, calm, breathing easily on room air, Neck: No JVD in seated position, no bruits. CARDIOVASCULAR:: Regular rate and rhythm with 1/6 murmur LSB CHEST:: Lungs clear, respirations unlabored, no crackles or wheezes, normal excursion. SKIN:: Uncovered skin is pink warm dry and intact. EXTREMITIES:: No edema.     Data:    Lab Results   Lab Name Value Date/Time    WBC 10.1 06/25/2019 03:33 AM    HGB 13.6 06/25/2019 03:33 AM    HCT 42.1 06/25/2019 03:33 AM    PLT 304 06/25/2019 03:33 AM       Lab Results   Lab Name Value Date/Time    NA 137 06/25/2019 04:06 AM    K 3.8 06/25/2019 04:06 AM    CL 102 06/25/2019 04:06 AM    CO2 25 06/25/2019 04:06 AM    BUN 39 (H) 06/25/2019 04:06 AM    CR 1.57 (H)  06/25/2019 04:06 AM    GLU 113 (H) 06/25/2019 04:06 AM     Urea Nitrogen, Blood (BUN)   Date Value Ref Range Status   06/25/2019 39 (H) 6 - 21 mg/dL Final     Creatinine Serum   Date Value Ref Range Status   06/25/2019 1.57 (H) 0.50 - 1.30 mg/dL Final   06/27/2019 58/25/1898 0.50 - 1.30 mg/dL Final   4.21 08/13/8116 0.50 - 1.30 mg/dL Final         PLAN    1. Coronary artery disease involving native coronary artery of native heart without angina pectoris  Clinically stable; Continue current medical therapy; Reinforced healthy lifestyle behaviors, such has consuming a healthy diet and regular cardiovascular exercise.    2. Chronic systolic heart failure (HCC)  Clinically stable. Notify the office of increase in SOB, weight, and/or edema.     3. Benign essential HTN  Elevated today, he has been off Entresto and carvedilol for several days, ran out  Sent new Rx   Monitor blood pressure, keep record, provide a copy of the readings at the next office visit. Notify the office if pressure consistently runs greater then 140/80.    4. ICD (implantable cardioverter-defibrillator) in place  Normal function; See scanned comprehensive report.    5. Mitral valve replaced  Last echo in our office 2019; Echo in 3 months to evaluate mitral valve    6. Hyperlipidemia, unspecified hyperlipidemia type  Repeat lipid panel and chem 14 in 3 months.    Return in 3 months or sooner should the need arise. Plan of care discussed with Dr 2020. All questions and concerns addressed.     Aubery Lapping, FNP  [facility]

## 2019-10-12 ENCOUNTER — Encounter (HOSPITAL_BASED_OUTPATIENT_CLINIC_OR_DEPARTMENT_OTHER): Payer: Self-pay | Admitting: Cardiovascular Disease

## 2019-10-13 ENCOUNTER — Ambulatory Visit (HOSPITAL_BASED_OUTPATIENT_CLINIC_OR_DEPARTMENT_OTHER): Payer: Medicare Other | Admitting: Nurse Practitioner

## 2019-10-13 ENCOUNTER — Encounter (HOSPITAL_BASED_OUTPATIENT_CLINIC_OR_DEPARTMENT_OTHER): Payer: Self-pay | Admitting: Nurse Practitioner

## 2019-10-13 VITALS — BP 152/78 | HR 87 | Resp 12 | Ht 68.0 in | Wt 143.0 lb

## 2019-10-13 DIAGNOSIS — Z95810 Presence of automatic (implantable) cardiac defibrillator: Secondary | ICD-10-CM

## 2019-10-13 DIAGNOSIS — Z952 Presence of prosthetic heart valve: Secondary | ICD-10-CM

## 2019-10-13 DIAGNOSIS — E785 Hyperlipidemia, unspecified: Secondary | ICD-10-CM

## 2019-10-13 DIAGNOSIS — I2510 Atherosclerotic heart disease of native coronary artery without angina pectoris: Secondary | ICD-10-CM

## 2019-10-13 DIAGNOSIS — I5022 Chronic systolic (congestive) heart failure: Secondary | ICD-10-CM

## 2019-10-13 DIAGNOSIS — I10 Essential (primary) hypertension: Secondary | ICD-10-CM

## 2019-10-13 MED ORDER — CARVEDILOL 6.25 MG TABLET: 6.2500 mg | ORAL_TABLET | Freq: Two times a day (BID) | ORAL | 6 refills | Status: DC

## 2019-10-13 MED ORDER — ALBUTEROL SULFATE HFA 90 MCG/ACTUATION AEROSOL INHALER: 1.0000 | INHALATION_SPRAY | RESPIRATORY_TRACT | 6 refills | Status: DC | PRN

## 2019-10-13 MED ORDER — ENTRESTO 97 MG-103 MG TABLET
1.00 | ORAL_TABLET | Freq: Two times a day (BID) | ORAL | 3 refills | Status: AC
Start: 2019-10-13 — End: 2020-01-11

## 2019-10-13 MED ORDER — ATORVASTATIN 20 MG TABLET: 20.0000 mg | ORAL_TABLET | Freq: Every day | ORAL | 6 refills | Status: DC

## 2019-10-14 NOTE — Progress Notes (Signed)
Patient scheduled for echo and follow up with Dr. Aubery Lapping (pacer check).  Patient notified and reminder letters mailed.

## 2019-10-26 DIAGNOSIS — R7301 Impaired fasting glucose: Secondary | ICD-10-CM | POA: Diagnosis not present

## 2019-10-26 DIAGNOSIS — E782 Mixed hyperlipidemia: Secondary | ICD-10-CM | POA: Diagnosis not present

## 2019-10-26 DIAGNOSIS — R69 Illness, unspecified: Secondary | ICD-10-CM | POA: Diagnosis not present

## 2019-10-26 DIAGNOSIS — I10 Essential (primary) hypertension: Secondary | ICD-10-CM | POA: Diagnosis not present

## 2019-10-29 DIAGNOSIS — Z0001 Encounter for general adult medical examination with abnormal findings: Secondary | ICD-10-CM | POA: Diagnosis not present

## 2019-10-29 DIAGNOSIS — D126 Benign neoplasm of colon, unspecified: Secondary | ICD-10-CM | POA: Diagnosis not present

## 2019-10-29 DIAGNOSIS — C61 Malignant neoplasm of prostate: Secondary | ICD-10-CM | POA: Diagnosis not present

## 2019-10-29 DIAGNOSIS — Z1331 Encounter for screening for depression: Secondary | ICD-10-CM | POA: Diagnosis not present

## 2019-10-29 DIAGNOSIS — I10 Essential (primary) hypertension: Secondary | ICD-10-CM | POA: Diagnosis not present

## 2019-10-29 DIAGNOSIS — E782 Mixed hyperlipidemia: Secondary | ICD-10-CM | POA: Diagnosis not present

## 2019-10-29 DIAGNOSIS — Z1389 Encounter for screening for other disorder: Secondary | ICD-10-CM | POA: Diagnosis not present

## 2019-10-29 DIAGNOSIS — Z23 Encounter for immunization: Secondary | ICD-10-CM | POA: Diagnosis not present

## 2019-11-06 DIAGNOSIS — C61 Malignant neoplasm of prostate: Secondary | ICD-10-CM | POA: Diagnosis not present

## 2019-11-09 DIAGNOSIS — H53001 Unspecified amblyopia, right eye: Secondary | ICD-10-CM | POA: Diagnosis not present

## 2019-11-09 DIAGNOSIS — Z947 Corneal transplant status: Secondary | ICD-10-CM | POA: Diagnosis not present

## 2019-11-09 DIAGNOSIS — H25813 Combined forms of age-related cataract, bilateral: Secondary | ICD-10-CM | POA: Diagnosis not present

## 2019-11-13 DIAGNOSIS — C61 Malignant neoplasm of prostate: Secondary | ICD-10-CM | POA: Diagnosis not present

## 2019-12-15 ENCOUNTER — Ambulatory Visit
Admission: RE | Admit: 2019-12-15 | Discharge: 2019-12-15 | Disposition: A | Discharge status: Home / Self Care | Payer: Medicare HMO | Source: Ambulatory Visit | Attending: Radiation Oncology | Admitting: Radiation Oncology

## 2019-12-15 ENCOUNTER — Other Ambulatory Visit: Payer: Self-pay

## 2019-12-15 ENCOUNTER — Encounter: Payer: Self-pay | Admitting: Radiation Oncology

## 2019-12-15 VITALS — Ht 73.5 in | Wt 240.0 lb

## 2019-12-15 DIAGNOSIS — Z9079 Acquired absence of other genital organ(s): Secondary | ICD-10-CM | POA: Diagnosis not present

## 2019-12-15 DIAGNOSIS — C61 Malignant neoplasm of prostate: Secondary | ICD-10-CM | POA: Diagnosis not present

## 2019-12-15 DIAGNOSIS — R9721 Rising PSA following treatment for malignant neoplasm of prostate: Secondary | ICD-10-CM | POA: Diagnosis not present

## 2019-12-15 DIAGNOSIS — Z800 Family history of malignant neoplasm of digestive organs: Secondary | ICD-10-CM | POA: Diagnosis not present

## 2019-12-15 HISTORY — DX: Malignant neoplasm of prostate: C61

## 2019-12-15 NOTE — Progress Notes (Signed)
Radiation Oncology         [phone] ________________________________  Initial Outpatient Consultation - Conducted via Telephone due to current COVID-19 concerns for limiting patient exposure  Name: Joshua Walters MRN: [id number]  Date: 12/15/2019  DOB: [birth year]  CC:No primary care provider on file.  Raynelle Bring, MD   REFERRING PHYSICIAN: Raynelle Bring, MD  DIAGNOSIS: 69 y.o. gentleman with biochemical recurrence of stage pT2cN0, Gleason 3+4 adenocarcinoma of the prostate with current PSA of 0.19.    ICD-10-CM   1. Malignant neoplasm of prostate ([place])  C61     HISTORY OF PRESENT ILLNESS: Joshua Walters is a 69 y.o. male with a history of prostate cancer. He was initially diagnosed with Gleason 3+4 adenocarcinoma of the prostate in 2016, with a pretreatment PSA of 4.8, and underwent a RALP with BPLND on 03/14/2015 under the care of Dr. Alinda Money.  Final surgical pathology revealed pT2cN0, Gleason 3+4 adenocarcinoma the prostate with a focally positive margin at the left mid posterior resection margin and perineural invasion.  There was no evidence of seminal vesicle involvement and no lymph node involvement.  His initial postoperative PSA was undetectable and remained undetectable until 03/23/2017 when it was noted very low detectable at 0.028.  It has continued to gradually rise since that time with a PSA of 0.10 in 04/2018 and further increased to 0.14 in 04/2019 and most recently, 0.19 on 11/06/2019.  The patient reviewed the surgical pathology and PSA results with his urologist and he has kindly been referred today for discussion of potential radiation treatment options.  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  PREVIOUS RADIATION THERAPY: No  PAST MEDICAL HISTORY:  Past Medical History:  Diagnosis Date  . Hypertension   . Kidney stones    hx of   . Pneumonia    hx of   . Prostate cancer ([place])         PAST SURGICAL HISTORY: Past Surgical History:  Procedure Laterality Date  . APPENDECTOMY    . bilateral corneal transplants     . HERNIA REPAIR     inguinal hernia on right x 2 last one with mesh   . LYMPHADENECTOMY Bilateral 03/14/2015   Procedure: PELVIC LYMPHADENECTOMY;  Surgeon: Raynelle Bring, MD;  Location: [facility];  Service: Urology;  Laterality: Bilateral;  . ROBOT ASSISTED LAPAROSCOPIC RADICAL PROSTATECTOMY N/A 03/14/2015   Procedure: ROBOTIC ASSISTED LAPAROSCOPIC RADICAL PROSTATECTOMY LEVEL 2;  Surgeon: Raynelle Bring, MD;  Location: [facility];  Service: Urology;  Laterality: N/A;  . TONSILLECTOMY     and adenoidectomy     FAMILY HISTORY:  Family History  Problem Relation Age of Onset  . Colon cancer Mother   . Lymphoma Father   . Cancer Maternal Aunt        type unknown  . Multiple myeloma Sister   . Prostate cancer Neg Hx   . Breast cancer Neg Hx   . Pancreatic cancer Neg Hx     SOCIAL HISTORY:  Social History   Socioeconomic History  . Marital status: Married    Spouse name: Not on file  . Number of children: Not on file  . Years of education: Not on file  . Highest education level: Not on file  Occupational History    Comment: retired  Tobacco Use  . Smoking status: Former Smoker    Packs/day: 2.00    Years: 25.00    Pack years:  50.00    Types: Cigarettes    Quit date: 06/05/2003    Years since quitting: 16.5  . Smokeless tobacco: Never Used  Vaping Use  . Vaping Use: Never used  Substance and Sexual Activity  . Alcohol use: Yes    Alcohol/week: 21.0 standard drinks    Types: 21 Cans of beer per week  . Drug use: No  . Sexual activity: Not Currently  Other Topics Concern  . Not on file  Social History Narrative  . Not on file   Social Determinants of Health   Financial Resource Strain:   . Difficulty of Paying Living Expenses:   Food Insecurity:   . Worried About Charity fundraiser in the Last Year:   . Arboriculturist in the Last  Year:   Transportation Needs:   . Film/video editor (Medical):   Marland Kitchen Lack of Transportation (Non-Medical):   Physical Activity:   . Days of Exercise per Week:   . Minutes of Exercise per Session:   Stress:   . Feeling of Stress :   Social Connections:   . Frequency of Communication with Friends and Family:   . Frequency of Social Gatherings with Friends and Family:   . Attends Religious Services:   . Active Member of Clubs or Organizations:   . Attends Archivist Meetings:   Marland Kitchen Marital Status:   Intimate Partner Violence:   . Fear of Current or Ex-Partner:   . Emotionally Abused:   Marland Kitchen Physically Abused:   . Sexually Abused:     ALLERGIES: Penicillins and Sulfa antibiotics  MEDICATIONS:  Current Outpatient Medications  Medication Sig Dispense Refill  . Cholecalciferol (VITAMIN D3) 1.25 MG (50000 UT) CAPS Take by mouth.    . famotidine (PEPCID) 20 MG tablet Take 20 mg by mouth 2 (two) times daily.    . Omega-3 1000 MG CAPS Take by mouth.    . simvastatin (ZOCOR) 40 MG tablet     . acetaminophen (TYLENOL) 500 MG tablet Take 1,000 mg by mouth every 6 (six) hours as needed for moderate pain. (Patient not taking: Reported on 12/15/2019)    . SHINGRIX injection  (Patient not taking: Reported on 12/15/2019)     No current facility-administered medications for this encounter.    REVIEW OF SYSTEMS:  On review of systems, the patient reports that he is doing well overall. He denies any chest pain, shortness of breath, cough, fevers, chills, night sweats, or unintended weight changes. He denies any bowel disturbances, and denies abdominal pain, nausea or vomiting. He denies any new musculoskeletal or joint aches or pains. His IPSS was 5, indicating mild urinary symptoms.  He reports that he has regained good bladder control since the time of surgery and no longer requires pads for protection.  His SHIM was 2, indicating he has severe erectile dysfunction which is not currently a  priority for him. A complete review of systems is obtained and is otherwise negative.    PHYSICAL EXAM:  Wt Readings from Last 3 Encounters:  12/15/19 240 lb (108.9 kg)  03/14/15 240 lb (108.9 kg)  03/03/15 240 lb (108.9 kg)   Temp Readings from Last 3 Encounters:  03/15/15 98.4 F (36.9 C) (Oral)  03/03/15 98.3 F (36.8 C) (Oral)   BP Readings from Last 3 Encounters:  03/15/15 116/62  03/03/15 (!) 156/76   Pulse Readings from Last 3 Encounters:  03/15/15 84  03/03/15 82   Pain Assessment Pain Score: 0-No  pain/10  Physical exam not performed in light of telephone consult visit format.   KPS = 100  100 - Normal; no complaints; no evidence of disease. 90   - Able to carry on normal activity; minor signs or symptoms of disease. 80   - Normal activity with effort; some signs or symptoms of disease. 85   - Cares for self; unable to carry on normal activity or to do active work. 60   - Requires occasional assistance, but is able to care for most of his personal needs. 50   - Requires considerable assistance and frequent medical care. 60   - Disabled; requires special care and assistance. 75   - Severely disabled; hospital admission is indicated although death not imminent. 31   - Very sick; hospital admission necessary; active supportive treatment necessary. 10   - Moribund; fatal processes progressing rapidly. 0     - Dead  Karnofsky DA, Abelmann Pearsall, Craver LS and [facility] [place] [phone]) The use of the nitrogen mustards in the palliative treatment of carcinoma: with particular reference to bronchogenic carcinoma Cancer 1 634-56  LABORATORY DATA:  Lab Results  Component Value Date   WBC 6.8 03/03/2015   HGB 12.2 (L) 03/15/2015   HCT 37.8 (L) 03/15/2015   MCV 96.1 03/03/2015   PLT 228 03/03/2015   Lab Results  Component Value Date   NA 140 03/03/2015   K 4.3 03/03/2015   CL 105 03/03/2015   CO2 27 03/03/2015   No results found for: ALT, AST, GGT, ALKPHOS, BILITOT       RADIOGRAPHY: No results found.    IMPRESSION/PLAN: This visit was conducted via Telephone to spare the patient unnecessary potential exposure in the healthcare setting during the current COVID-19 pandemic. 1. 69 y.o. gentleman with biochemical recurrence of stage pT2cN0, Gleason 3+4 adenocarcinoma of the prostate with current PSA of 0.19.  Today,  reviewed the findings and workup thus far.  We discussed the natural history of prostate cancer.  We reviewed the the implications of positive margins, extracapsular extension, and seminal vesicle involvement on the risk of prostate cancer recurrence, particularly in light of detectable, rising postoperative PSA. We reviewed some of the evidence suggesting an advantage for patients who undergo salvage radiotherapy in the setting in terms of disease control and overall survival. We also reviewed data from the [facility] trial regarding use of ADT in combination with salvage XRT to the prostate fossa and pelvic lymph nodes in the setting of biochemical recurrence. This study shows that extending radiation therapy to the pelvic lymph nodes combined with adding short-term hormone therapy to standard treatment of the prostate surgical bed can improve the liklihood for curative treatment and/or extend the amount of time before disease progression but does not appear to demonstrate an overall survival benefit.  Following a lengthy discussion of this topic, the patient is interested in proceeding with the use of ST-ADT concurrent with salvage radiotherapy.  We discussed radiation treatment directed to the prostatic fossa and pelvic lymph nodes with regard to the logistics and delivery of external beam radiation treatment.  The recommendation is for a 7-1/2-week course or 38 daily radiation treatments concurrent with ST-ADT.  He was encouraged to ask questions that were answered to his stated satisfaction.  At the conclusion of our conversation, the patient would like  to proceed with a 7-1/2-week course of salvage radiotherapy directed to the prostate fossa and pelvic lymph nodes concurrent with ST-ADT.  He is due for his screening  colonoscopy and reports that he does have a family history of colon cancer and a previous personal history of precancerous polyps.  Therefore, we would recommend starting ADT now, which would allow a safe delay of the start of radiotherapy, allowing him to move forward with colonoscopy and/or any necessary excision/biopsy of polyps prior to proceeding with radiotherapy.  He appears to have a good understanding of his disease and our treatment recommendations which are of curative intent.  We will share our findings with Dr. Alinda Money and Dr. Karlyn Agee and will await clearance, following upcoming colonoscopy procedure prior to proceeding with scheduling his radiation treatments.      Given current concerns for patient exposure during the COVID-19 pandemic, this encounter was conducted via telephone. The patient was notified in advance and was offered a MyChart meeting to allow for face to face communication but unfortunately reported that he did not have the appropriate resources/technology to support such a visit and instead preferred to proceed with telephone consult. The patient has given verbal consent for this type of encounter. The time spent during this encounter was 60 minutes. The attendants for this meeting include Tyler Pita MD, [name] Bruning PA-C, [employer] scribe, and patient, Joshua Walters. During the encounter, Tyler Pita MD, [name] Bruning PA-C, and scribe, [employer] were located at [place].  Patient, Joshua Walters was located at home.    Nicholos Johns, PA-C    Tyler Pita, MD  [facility] Direct Dial: [phone]  Fax: [phone] [email or web address]  Skype  [email or web address]  This document serves as a record of services  personally performed by Tyler Pita, MD and Freeman Caldron, PA-C. It was created on their behalf by [employer], a trained medical scribe. The creation of this record is based on the scribe's personal observations and the provider's statements to them. This document has been checked and approved by the attending provider.

## 2019-12-15 NOTE — Progress Notes (Signed)
GU Location of Tumor / Histology: prostatic adenocarcinoma  If Prostate Cancer, Gleason Score is (4 + 3) and PSA is (4.8) pre treatment.   Joshua Walters had a prostatectomy 03/14/2015 under the care of Dr. Alinda Money. Unfortunately, the patient's psa had risen over the years. PSA collected 11/06/19 was 0.19.    Past/Anticipated interventions by urology, if any: prostatectomy, active surveillance, referral to Dr. Tammi Klippel for consideration of radiation therapy.  Past/Anticipated interventions by medical oncology, if any: no  Weight changes, if any: no  Bowel/Bladder complaints, if any: IPSS 5. Denies dysuria or hematuria. Reports good continence s/p prostatectomy. Denies any bowel complaints. SHIM 2 due to inactivity. Patient and his wife care for their grandchild that has down syndrome.    Nausea/Vomiting, if any: no  Pain issues, if any:  no  SAFETY ISSUES:  Prior radiation? denies  Pacemaker/ICD? denies  Possible current pregnancy? no, male patient  Is the patient on methotrexate? no  Current Complaints / other details:  69 year old male. Married. Former smoker.

## 2019-12-18 ENCOUNTER — Telehealth: Payer: Self-pay | Admitting: Radiation Oncology

## 2019-12-18 NOTE — Telephone Encounter (Signed)
Received epic inbasket message from Romie Jumper that this patient has questions and request a return call. Phoned patient to inquire. Patient explains his doctor in [place] would like to have a consultation August 4th to discuss colonoscopy then work him in for a colonoscopy 3-4 weeks after that visit. Patient questions if pushing his radiation this far out would be detrimental. Patient verbalized his understanding that if he waits until after radiation to have the colonoscopy he would have to wait 4-6 months. Patient understands this RN will relay his questions to [employer], PA-C and phone him back with answers.

## 2019-12-18 NOTE — Telephone Encounter (Signed)
-----   Message from Kerri Perches sent at 12/17/2019  3:11 PM EDT ----- Regarding: QUESTIONS Hi [name],   This patient has called and said that he has some questions, he asked for [name], but I know she would want you to have this.  Joshua Walters' phone number is [phone].  Thanks,  [employer]

## 2019-12-21 ENCOUNTER — Telehealth: Payer: Self-pay | Admitting: *Deleted

## 2019-12-21 NOTE — Telephone Encounter (Signed)
CALLED PATIENT TO INFORM OF ADT FOR 12/25/19 - ARRIVAL TIME- 12:45 PM @ DR. BORDEN'S OFFICE, SPOKE WITH PATIENT'S WIFE- LYNN AND SHE IS AWARE OF THIS APPT.

## 2019-12-22 NOTE — Telephone Encounter (Signed)
That timeline is fine but we would recommend he have a repeat PSA with Dr. Alinda Money prior to radiation treatment to establish a current baseline PSA since his last PSA was 11/2019 and we will be looking at starting treatment in 02/2020, at earliest, pending no need for surgical excision of polyps.  I sent a copy of our consult note to both Dr. Alinda Money and his surgeon so they are aware as well. -Raymond Azure

## 2019-12-23 ENCOUNTER — Telehealth: Payer: Self-pay | Admitting: Radiation Oncology

## 2019-12-23 NOTE — Telephone Encounter (Signed)
Phoned patient with Freeman Caldron, PA-C response to our 12/18/2019 conversation. Patient verbalizes that colon cancer runs in his family. Explained that per [name] having a colonoscopy in August is fine. Requested he schedule a repeat PSA with Dr. Lynne Logan office for the end of August in preparation for the start of radiation in September since his last PSA was done in June. Patient committed to scheduling this lab draw on Friday, July 23 while he is at [place] office receiving his hormone shot. Explained that per [name] we are looking at starting his treatment in September at the earliest pending no need for surgical excision of polyps. Also, explained that [name] has shared a copy of our consult noted with Dr. Alinda Money and his surgeon. Patient verbalized understanding of all reviewed and appreciation for the assistance.

## 2019-12-25 DIAGNOSIS — Z5111 Encounter for antineoplastic chemotherapy: Secondary | ICD-10-CM | POA: Diagnosis not present

## 2019-12-25 DIAGNOSIS — C61 Malignant neoplasm of prostate: Secondary | ICD-10-CM | POA: Diagnosis not present

## 2019-12-28 ENCOUNTER — Encounter: Payer: Self-pay | Admitting: Medical Oncology

## 2019-12-28 NOTE — Progress Notes (Signed)
Spoke with patient to introduce myself as the prostate nurse navigator and discuss my role. I was unable to meet him 7/13, when he consulted with Dr.Manning/[name], PA. He is post robotic prostatectomy 2016 and now with rising PSA.Marland Kitchen He received his ADT 7/23 and has not noted any side effects yet. We discussed possible side effects of ADT. He states Dr. Tammi Klippel had requested he get a repeat PSA before treatment, but Dr. Alinda Money told him the ADT would lower the PSA , so he will have it checked in December. He is scheduled for colonoscopy in August and will start radiation post procedure. I gave him my contact information and asked him to call me with questions or concerns. He voiced understanding.

## 2020-01-05 ENCOUNTER — Encounter: Payer: Self-pay | Admitting: Urology

## 2020-01-05 NOTE — Progress Notes (Signed)
Per note from Dr. Alinda Money, 12/25/19, patient has agreed to proceed with ST-ADT now and will begin his salvage XRT following upcoming colonoscopy. He received 45mg  Eligard injection 12/25/19. We will await clearance from his surgeon, Dr. Karlyn Agee, prior to scheduling the salvage XRT.  Nicholos Johns, MMS, PA-C [facility] [place]: [phone]  Fax: [phone]

## 2020-01-06 ENCOUNTER — Telehealth: Payer: Self-pay

## 2020-01-06 DIAGNOSIS — F1110 Opioid abuse, uncomplicated: Secondary | ICD-10-CM

## 2020-01-06 DIAGNOSIS — Z1211 Encounter for screening for malignant neoplasm of colon: Secondary | ICD-10-CM | POA: Diagnosis not present

## 2020-01-06 NOTE — Telephone Encounter (Signed)
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 He wants to know if Dr. Aubery Lapping can refer him since he doesn't have an established PCP.     He also asked if we could get him set up with a new PCP. I told him I would mail the list of those accepting new patients too.

## 2020-01-12 ENCOUNTER — Encounter: Payer: Self-pay | Admitting: Medical Oncology

## 2020-01-14 ENCOUNTER — Telehealth: Payer: Self-pay | Admitting: *Deleted

## 2020-01-14 ENCOUNTER — Ambulatory Visit: Payer: Medicaid (Managed Care)

## 2020-01-14 NOTE — Telephone Encounter (Signed)
[facility] called stating the called pt on 8/5 and he told them he was going through withdrawal. They recommended he go to the ER to get started on treatment. He didn't go. Since then, they have been trying to call him daily and his phone is not accepting messages. They are going to close the referral, but keep trying to call him weekly. She asked that when he comes in for his 9/15 appt with you, we send him over to their clinic as a "walk in."

## 2020-01-14 NOTE — Telephone Encounter (Signed)
Called checking on patient to see if he had his colonscopy done, patient informed me that he would have it done on 01-15-20

## 2020-01-15 DIAGNOSIS — K5730 Diverticulosis of large intestine without perforation or abscess without bleeding: Secondary | ICD-10-CM | POA: Diagnosis not present

## 2020-01-15 DIAGNOSIS — Z880 Allergy status to penicillin: Secondary | ICD-10-CM | POA: Diagnosis not present

## 2020-01-15 DIAGNOSIS — D126 Benign neoplasm of colon, unspecified: Secondary | ICD-10-CM | POA: Diagnosis not present

## 2020-01-15 DIAGNOSIS — K621 Rectal polyp: Secondary | ICD-10-CM | POA: Diagnosis not present

## 2020-01-15 DIAGNOSIS — Z86010 Personal history of colonic polyps: Secondary | ICD-10-CM | POA: Diagnosis not present

## 2020-01-15 DIAGNOSIS — Z87891 Personal history of nicotine dependence: Secondary | ICD-10-CM | POA: Diagnosis not present

## 2020-01-15 DIAGNOSIS — E785 Hyperlipidemia, unspecified: Secondary | ICD-10-CM | POA: Diagnosis not present

## 2020-01-15 DIAGNOSIS — K635 Polyp of colon: Secondary | ICD-10-CM | POA: Diagnosis not present

## 2020-01-15 DIAGNOSIS — Z800 Family history of malignant neoplasm of digestive organs: Secondary | ICD-10-CM | POA: Diagnosis not present

## 2020-01-15 DIAGNOSIS — Z882 Allergy status to sulfonamides status: Secondary | ICD-10-CM | POA: Diagnosis not present

## 2020-01-15 DIAGNOSIS — I10 Essential (primary) hypertension: Secondary | ICD-10-CM | POA: Diagnosis not present

## 2020-02-03 ENCOUNTER — Telehealth: Payer: Self-pay | Admitting: *Deleted

## 2020-02-03 NOTE — Telephone Encounter (Signed)
CALLED PATIENT TO INFORM OF APPT. WITH DR. Moorpark CLEARANCE ON 02-10-20 - ARRIVAL TIME- 11:30 AM, SPOKE WITH PATIENT AND HE IS AWARE OF THIS APPT.

## 2020-02-04 ENCOUNTER — Ambulatory Visit (HOSPITAL_BASED_OUTPATIENT_CLINIC_OR_DEPARTMENT_OTHER): Payer: Self-pay | Admitting: Cardiovascular Disease

## 2020-02-10 DIAGNOSIS — Z6831 Body mass index (BMI) 31.0-31.9, adult: Secondary | ICD-10-CM | POA: Diagnosis not present

## 2020-02-10 DIAGNOSIS — E782 Mixed hyperlipidemia: Secondary | ICD-10-CM | POA: Diagnosis not present

## 2020-02-10 DIAGNOSIS — Z23 Encounter for immunization: Secondary | ICD-10-CM | POA: Diagnosis not present

## 2020-02-10 DIAGNOSIS — I10 Essential (primary) hypertension: Secondary | ICD-10-CM | POA: Diagnosis not present

## 2020-02-10 DIAGNOSIS — Z9079 Acquired absence of other genital organ(s): Secondary | ICD-10-CM | POA: Diagnosis not present

## 2020-02-10 DIAGNOSIS — C61 Malignant neoplasm of prostate: Secondary | ICD-10-CM | POA: Diagnosis not present

## 2020-02-15 ENCOUNTER — Telehealth: Payer: Self-pay | Admitting: Radiation Oncology

## 2020-02-15 ENCOUNTER — Encounter: Payer: Self-pay | Admitting: Radiation Oncology

## 2020-02-15 DIAGNOSIS — D126 Benign neoplasm of colon, unspecified: Secondary | ICD-10-CM | POA: Diagnosis not present

## 2020-02-15 NOTE — Telephone Encounter (Signed)
-----   Message from Freeman Caldron, [place] sent at 02/12/2020 11:15 AM EDT ----- Hmmm.  [facility] is his PCP.... We are really just wanting to know, from a procedural standpoint, s/p recent colonoscopy with polyp removal, that we are good to move forward with radiation or do we need to wait a certain time period? That will need to come from Dr. Ladona Horns. Lia Foyer ----- Message ----- From: Kerri Perches Sent: 02/11/2020  12:17 PM EDT To: Freeman Caldron, PA-C  Hi [name],   They sent this patient to Dr. Pleas Koch to get this done, he had an appt. yesterday, I'll give them a call and see if I can get a note.  Enid Derry

## 2020-02-15 NOTE — Telephone Encounter (Signed)
Typed letter requested clearance to proceed with radiation therapy from a GI standpoint s/p colonoscopy with polyp removal. Faxed letter and clearance form to Dr. Karlyn Agee at [phone]. Phoned Dr. Sheron Nightingale office, spoke with [place]. She confirms receipt of the fax.

## 2020-02-17 ENCOUNTER — Encounter (HOSPITAL_BASED_OUTPATIENT_CLINIC_OR_DEPARTMENT_OTHER): Payer: Self-pay

## 2020-02-22 ENCOUNTER — Telehealth: Payer: Self-pay | Admitting: *Deleted

## 2020-02-22 ENCOUNTER — Encounter: Payer: Self-pay | Admitting: Medical Oncology

## 2020-02-22 NOTE — Progress Notes (Signed)
Returned call to patient regarding CT simulation appointment.I left a message to inform him,  Enid Derry, is aware we received clearance from GI. Enid Derry will call him with CT simulation appointment.

## 2020-02-22 NOTE — Telephone Encounter (Signed)
CALLED PATIENT TO INFORM OF SIM APPT. FOR 03-04-20 - ARRIVAL TIME- 9:45 AM @ DR. MANNING'S OFFICE, SPOKE WITH PATIENT AND HE IS AWARE OF THIS APPT.

## 2020-02-25 DIAGNOSIS — Z0100 Encounter for examination of eyes and vision without abnormal findings: Secondary | ICD-10-CM | POA: Diagnosis not present

## 2020-02-27 ENCOUNTER — Ambulatory Visit
Admission: RE | Admit: 2020-02-27 | Discharge: 2020-02-27 | Disposition: A | Discharge status: Home / Self Care | Payer: Medicare Other | Source: Ambulatory Visit | Attending: Nurse Practitioner | Admitting: Nurse Practitioner

## 2020-02-27 DIAGNOSIS — I5022 Chronic systolic (congestive) heart failure: Secondary | ICD-10-CM

## 2020-02-27 DIAGNOSIS — Z952 Presence of prosthetic heart valve: Secondary | ICD-10-CM

## 2020-03-03 ENCOUNTER — Telehealth: Payer: Self-pay | Admitting: *Deleted

## 2020-03-03 NOTE — Telephone Encounter (Signed)
CALLED PATIENT TO REMIND OF SIM APPT. FOR 03-04-20, ARRIVAL TIME- 9:45 AM @ [facility], SPOKE WITH PATIENT'S WIFE - LYNN AND SHE IS AWARE OF THIS APPT.

## 2020-03-04 ENCOUNTER — Ambulatory Visit
Admission: RE | Admit: 2020-03-04 | Discharge: 2020-03-04 | Disposition: A | Discharge status: Home / Self Care | Payer: Medicare HMO | Source: Ambulatory Visit | Attending: Radiation Oncology | Admitting: Radiation Oncology

## 2020-03-04 ENCOUNTER — Other Ambulatory Visit: Payer: Self-pay

## 2020-03-04 ENCOUNTER — Encounter: Payer: Self-pay | Admitting: Medical Oncology

## 2020-03-04 DIAGNOSIS — Z510 Encounter for antineoplastic radiation therapy: Secondary | ICD-10-CM | POA: Insufficient documentation

## 2020-03-04 DIAGNOSIS — C61 Malignant neoplasm of prostate: Secondary | ICD-10-CM | POA: Insufficient documentation

## 2020-03-07 NOTE — Progress Notes (Signed)
  Radiation Oncology         [phone] ________________________________  Name: Joshua Walters MRN: [id number]  Date: 03/04/2020  DOB: [birth year]  SIMULATION AND TREATMENT PLANNING NOTE    ICD-10-CM   1. Prostate cancer ([place])  C61     DIAGNOSIS:  69 y.o. gentleman with biochemical recurrence of stage pT2cN0, Gleason 3+4 adenocarcinoma of the prostate with current PSA of 0.19.  NARRATIVE:  The patient was brought to the [facility].  Identity was confirmed.  All relevant records and images related to the planned course of therapy were reviewed.  The patient freely provided informed written consent to proceed with treatment after reviewing the details related to the planned course of therapy. The consent form was witnessed and verified by the simulation staff.  Then, the patient was set-up in a stable reproducible supine position for radiation therapy.  A vacuum lock pillow device was custom fabricated to position his legs in a reproducible immobilized position.  Then, I performed a urethrogram under sterile conditions to identify the prostatic apex.  CT images were obtained.  Surface markings were placed.  The CT images were loaded into the planning software.  Then the prostate target and avoidance structures including the rectum, bladder, bowel and hips were contoured.  Treatment planning then occurred.  The radiation prescription was entered and confirmed.  A total of one complex treatment devices was fabricated. I have requested : Intensity Modulated Radiotherapy (IMRT) is medically necessary for this case for the following reason:  Rectal sparing.Marland Kitchen  PLAN:  The patient will receive 68.4 Gy in 38 fractions.  ________________________________  Sheral Apley Tammi Klippel, M.D.

## 2020-03-10 DIAGNOSIS — Z23 Encounter for immunization: Secondary | ICD-10-CM | POA: Diagnosis not present

## 2020-03-14 DIAGNOSIS — Z510 Encounter for antineoplastic radiation therapy: Secondary | ICD-10-CM | POA: Diagnosis not present

## 2020-03-14 DIAGNOSIS — C61 Malignant neoplasm of prostate: Secondary | ICD-10-CM | POA: Diagnosis not present

## 2020-03-15 ENCOUNTER — Other Ambulatory Visit: Payer: Self-pay

## 2020-03-15 ENCOUNTER — Ambulatory Visit
Admission: RE | Admit: 2020-03-15 | Discharge: 2020-03-15 | Disposition: A | Discharge status: Home / Self Care | Payer: Medicare HMO | Source: Ambulatory Visit | Attending: Radiation Oncology | Admitting: Radiation Oncology

## 2020-03-15 DIAGNOSIS — C61 Malignant neoplasm of prostate: Secondary | ICD-10-CM | POA: Diagnosis not present

## 2020-03-15 DIAGNOSIS — Z510 Encounter for antineoplastic radiation therapy: Secondary | ICD-10-CM | POA: Diagnosis not present

## 2020-03-16 ENCOUNTER — Ambulatory Visit
Admission: RE | Admit: 2020-03-16 | Discharge: 2020-03-16 | Disposition: A | Discharge status: Home / Self Care | Payer: Medicare HMO | Source: Ambulatory Visit | Attending: Radiation Oncology | Admitting: Radiation Oncology

## 2020-03-16 DIAGNOSIS — Z510 Encounter for antineoplastic radiation therapy: Secondary | ICD-10-CM | POA: Diagnosis not present

## 2020-03-16 DIAGNOSIS — C61 Malignant neoplasm of prostate: Secondary | ICD-10-CM | POA: Diagnosis not present

## 2020-03-17 ENCOUNTER — Ambulatory Visit
Admission: RE | Admit: 2020-03-17 | Discharge: 2020-03-17 | Disposition: A | Discharge status: Home / Self Care | Payer: Medicare HMO | Source: Ambulatory Visit | Attending: Radiation Oncology | Admitting: Radiation Oncology

## 2020-03-17 DIAGNOSIS — Z510 Encounter for antineoplastic radiation therapy: Secondary | ICD-10-CM | POA: Diagnosis not present

## 2020-03-17 DIAGNOSIS — C61 Malignant neoplasm of prostate: Secondary | ICD-10-CM | POA: Diagnosis not present

## 2020-03-18 ENCOUNTER — Ambulatory Visit
Admission: RE | Admit: 2020-03-18 | Discharge: 2020-03-18 | Disposition: A | Discharge status: Home / Self Care | Payer: Medicare HMO | Source: Ambulatory Visit | Attending: Radiation Oncology | Admitting: Radiation Oncology

## 2020-03-18 DIAGNOSIS — C61 Malignant neoplasm of prostate: Secondary | ICD-10-CM | POA: Diagnosis not present

## 2020-03-18 DIAGNOSIS — Z510 Encounter for antineoplastic radiation therapy: Secondary | ICD-10-CM | POA: Diagnosis not present

## 2020-03-21 ENCOUNTER — Ambulatory Visit
Admission: RE | Admit: 2020-03-21 | Discharge: 2020-03-21 | Disposition: A | Discharge status: Home / Self Care | Payer: Medicare HMO | Source: Ambulatory Visit | Attending: Radiation Oncology | Admitting: Radiation Oncology

## 2020-03-21 DIAGNOSIS — Z510 Encounter for antineoplastic radiation therapy: Secondary | ICD-10-CM | POA: Diagnosis not present

## 2020-03-21 DIAGNOSIS — C61 Malignant neoplasm of prostate: Secondary | ICD-10-CM | POA: Diagnosis not present

## 2020-03-22 ENCOUNTER — Ambulatory Visit
Admission: RE | Admit: 2020-03-22 | Discharge: 2020-03-22 | Disposition: A | Discharge status: Home / Self Care | Payer: Medicare HMO | Source: Ambulatory Visit | Attending: Radiation Oncology | Admitting: Radiation Oncology

## 2020-03-22 ENCOUNTER — Other Ambulatory Visit: Payer: Self-pay

## 2020-03-22 DIAGNOSIS — Z510 Encounter for antineoplastic radiation therapy: Secondary | ICD-10-CM | POA: Diagnosis not present

## 2020-03-22 DIAGNOSIS — C61 Malignant neoplasm of prostate: Secondary | ICD-10-CM | POA: Diagnosis not present

## 2020-03-23 ENCOUNTER — Ambulatory Visit
Admission: RE | Admit: 2020-03-23 | Discharge: 2020-03-23 | Disposition: A | Discharge status: Home / Self Care | Payer: Medicare HMO | Source: Ambulatory Visit | Attending: Radiation Oncology | Admitting: Radiation Oncology

## 2020-03-23 ENCOUNTER — Encounter (HOSPITAL_BASED_OUTPATIENT_CLINIC_OR_DEPARTMENT_OTHER): Payer: Self-pay

## 2020-03-23 DIAGNOSIS — Z510 Encounter for antineoplastic radiation therapy: Secondary | ICD-10-CM | POA: Diagnosis not present

## 2020-03-23 DIAGNOSIS — C61 Malignant neoplasm of prostate: Secondary | ICD-10-CM | POA: Diagnosis not present

## 2020-03-24 ENCOUNTER — Ambulatory Visit
Admission: RE | Admit: 2020-03-24 | Discharge: 2020-03-24 | Disposition: A | Discharge status: Home / Self Care | Payer: Medicare HMO | Source: Ambulatory Visit | Attending: Radiation Oncology | Admitting: Radiation Oncology

## 2020-03-24 DIAGNOSIS — C61 Malignant neoplasm of prostate: Secondary | ICD-10-CM | POA: Diagnosis not present

## 2020-03-24 DIAGNOSIS — Z510 Encounter for antineoplastic radiation therapy: Secondary | ICD-10-CM | POA: Diagnosis not present

## 2020-03-25 ENCOUNTER — Ambulatory Visit
Admission: RE | Admit: 2020-03-25 | Discharge: 2020-03-25 | Disposition: A | Discharge status: Home / Self Care | Payer: Medicare HMO | Source: Ambulatory Visit | Attending: Radiation Oncology | Admitting: Radiation Oncology

## 2020-03-25 DIAGNOSIS — Z510 Encounter for antineoplastic radiation therapy: Secondary | ICD-10-CM | POA: Diagnosis not present

## 2020-03-25 DIAGNOSIS — C61 Malignant neoplasm of prostate: Secondary | ICD-10-CM | POA: Diagnosis not present

## 2020-03-28 ENCOUNTER — Ambulatory Visit
Admission: RE | Admit: 2020-03-28 | Discharge: 2020-03-28 | Disposition: A | Discharge status: Home / Self Care | Payer: Medicare HMO | Source: Ambulatory Visit | Attending: Radiation Oncology | Admitting: Radiation Oncology

## 2020-03-28 DIAGNOSIS — C61 Malignant neoplasm of prostate: Secondary | ICD-10-CM | POA: Diagnosis not present

## 2020-03-28 DIAGNOSIS — Z510 Encounter for antineoplastic radiation therapy: Secondary | ICD-10-CM | POA: Diagnosis not present

## 2020-03-29 ENCOUNTER — Ambulatory Visit
Admission: RE | Admit: 2020-03-29 | Discharge: 2020-03-29 | Disposition: A | Discharge status: Home / Self Care | Payer: Medicare HMO | Source: Ambulatory Visit | Attending: Radiation Oncology | Admitting: Radiation Oncology

## 2020-03-29 DIAGNOSIS — C61 Malignant neoplasm of prostate: Secondary | ICD-10-CM | POA: Diagnosis not present

## 2020-03-29 DIAGNOSIS — Z510 Encounter for antineoplastic radiation therapy: Secondary | ICD-10-CM | POA: Diagnosis not present

## 2020-03-30 ENCOUNTER — Ambulatory Visit
Admission: RE | Admit: 2020-03-30 | Discharge: 2020-03-30 | Disposition: A | Discharge status: Home / Self Care | Payer: Medicare HMO | Source: Ambulatory Visit | Attending: Radiation Oncology | Admitting: Radiation Oncology

## 2020-03-30 DIAGNOSIS — Z510 Encounter for antineoplastic radiation therapy: Secondary | ICD-10-CM | POA: Diagnosis not present

## 2020-03-30 DIAGNOSIS — C61 Malignant neoplasm of prostate: Secondary | ICD-10-CM | POA: Diagnosis not present

## 2020-03-31 ENCOUNTER — Ambulatory Visit
Admission: RE | Admit: 2020-03-31 | Discharge: 2020-03-31 | Disposition: A | Discharge status: Home / Self Care | Payer: Medicare HMO | Source: Ambulatory Visit | Attending: Radiation Oncology | Admitting: Radiation Oncology

## 2020-03-31 DIAGNOSIS — C61 Malignant neoplasm of prostate: Secondary | ICD-10-CM | POA: Diagnosis not present

## 2020-03-31 DIAGNOSIS — Z510 Encounter for antineoplastic radiation therapy: Secondary | ICD-10-CM | POA: Diagnosis not present

## 2020-04-01 ENCOUNTER — Encounter: Payer: Self-pay | Admitting: Medical Oncology

## 2020-04-01 ENCOUNTER — Ambulatory Visit
Admission: RE | Admit: 2020-04-01 | Discharge: 2020-04-01 | Disposition: A | Discharge status: Home / Self Care | Payer: Medicare HMO | Source: Ambulatory Visit | Attending: Radiation Oncology | Admitting: Radiation Oncology

## 2020-04-01 DIAGNOSIS — C61 Malignant neoplasm of prostate: Secondary | ICD-10-CM | POA: Diagnosis not present

## 2020-04-01 DIAGNOSIS — Z510 Encounter for antineoplastic radiation therapy: Secondary | ICD-10-CM | POA: Diagnosis not present

## 2020-04-04 ENCOUNTER — Ambulatory Visit
Admission: RE | Admit: 2020-04-04 | Discharge: 2020-04-04 | Disposition: A | Discharge status: Home / Self Care | Payer: Medicare HMO | Source: Ambulatory Visit | Attending: Radiation Oncology | Admitting: Radiation Oncology

## 2020-04-04 ENCOUNTER — Other Ambulatory Visit: Payer: Self-pay

## 2020-04-04 DIAGNOSIS — Z510 Encounter for antineoplastic radiation therapy: Secondary | ICD-10-CM | POA: Diagnosis not present

## 2020-04-04 DIAGNOSIS — C61 Malignant neoplasm of prostate: Secondary | ICD-10-CM | POA: Diagnosis not present

## 2020-04-05 ENCOUNTER — Ambulatory Visit
Admission: RE | Admit: 2020-04-05 | Discharge: 2020-04-05 | Disposition: A | Discharge status: Home / Self Care | Payer: Medicare HMO | Source: Ambulatory Visit | Attending: Radiation Oncology | Admitting: Radiation Oncology

## 2020-04-05 ENCOUNTER — Other Ambulatory Visit: Payer: Self-pay

## 2020-04-05 DIAGNOSIS — C61 Malignant neoplasm of prostate: Secondary | ICD-10-CM | POA: Diagnosis not present

## 2020-04-05 DIAGNOSIS — Z510 Encounter for antineoplastic radiation therapy: Secondary | ICD-10-CM | POA: Diagnosis not present

## 2020-04-06 ENCOUNTER — Ambulatory Visit
Admission: RE | Admit: 2020-04-06 | Discharge: 2020-04-06 | Disposition: A | Discharge status: Home / Self Care | Payer: Medicare HMO | Source: Ambulatory Visit | Attending: Radiation Oncology | Admitting: Radiation Oncology

## 2020-04-06 DIAGNOSIS — C61 Malignant neoplasm of prostate: Secondary | ICD-10-CM | POA: Diagnosis not present

## 2020-04-06 DIAGNOSIS — Z510 Encounter for antineoplastic radiation therapy: Secondary | ICD-10-CM | POA: Diagnosis not present

## 2020-04-07 ENCOUNTER — Ambulatory Visit
Admission: RE | Admit: 2020-04-07 | Discharge: 2020-04-07 | Disposition: A | Discharge status: Home / Self Care | Payer: Medicare HMO | Source: Ambulatory Visit | Attending: Radiation Oncology | Admitting: Radiation Oncology

## 2020-04-07 DIAGNOSIS — C61 Malignant neoplasm of prostate: Secondary | ICD-10-CM | POA: Diagnosis not present

## 2020-04-07 DIAGNOSIS — Z510 Encounter for antineoplastic radiation therapy: Secondary | ICD-10-CM | POA: Diagnosis not present

## 2020-04-08 ENCOUNTER — Ambulatory Visit
Admission: RE | Admit: 2020-04-08 | Discharge: 2020-04-08 | Disposition: A | Discharge status: Home / Self Care | Payer: Medicare HMO | Source: Ambulatory Visit | Attending: Radiation Oncology | Admitting: Radiation Oncology

## 2020-04-08 DIAGNOSIS — C61 Malignant neoplasm of prostate: Secondary | ICD-10-CM | POA: Diagnosis not present

## 2020-04-08 DIAGNOSIS — Z510 Encounter for antineoplastic radiation therapy: Secondary | ICD-10-CM | POA: Diagnosis not present

## 2020-04-11 ENCOUNTER — Ambulatory Visit
Admission: RE | Admit: 2020-04-11 | Discharge: 2020-04-11 | Disposition: A | Discharge status: Home / Self Care | Payer: Medicare HMO | Source: Ambulatory Visit | Attending: Radiation Oncology | Admitting: Radiation Oncology

## 2020-04-11 DIAGNOSIS — C61 Malignant neoplasm of prostate: Secondary | ICD-10-CM | POA: Diagnosis not present

## 2020-04-11 DIAGNOSIS — Z510 Encounter for antineoplastic radiation therapy: Secondary | ICD-10-CM | POA: Diagnosis not present

## 2020-04-12 ENCOUNTER — Ambulatory Visit
Admission: RE | Admit: 2020-04-12 | Discharge: 2020-04-12 | Disposition: A | Discharge status: Home / Self Care | Payer: Medicare HMO | Source: Ambulatory Visit | Attending: Radiation Oncology | Admitting: Radiation Oncology

## 2020-04-12 DIAGNOSIS — Z510 Encounter for antineoplastic radiation therapy: Secondary | ICD-10-CM | POA: Diagnosis not present

## 2020-04-12 DIAGNOSIS — C61 Malignant neoplasm of prostate: Secondary | ICD-10-CM | POA: Diagnosis not present

## 2020-04-13 ENCOUNTER — Ambulatory Visit
Admission: RE | Admit: 2020-04-13 | Discharge: 2020-04-13 | Disposition: A | Discharge status: Home / Self Care | Payer: Medicare HMO | Source: Ambulatory Visit | Attending: Radiation Oncology | Admitting: Radiation Oncology

## 2020-04-13 DIAGNOSIS — Z510 Encounter for antineoplastic radiation therapy: Secondary | ICD-10-CM | POA: Diagnosis not present

## 2020-04-13 DIAGNOSIS — C61 Malignant neoplasm of prostate: Secondary | ICD-10-CM | POA: Diagnosis not present

## 2020-04-14 ENCOUNTER — Ambulatory Visit
Admission: RE | Admit: 2020-04-14 | Discharge: 2020-04-14 | Disposition: A | Discharge status: Home / Self Care | Payer: Medicare HMO | Source: Ambulatory Visit | Attending: Radiation Oncology | Admitting: Radiation Oncology

## 2020-04-14 DIAGNOSIS — Z510 Encounter for antineoplastic radiation therapy: Secondary | ICD-10-CM | POA: Diagnosis not present

## 2020-04-14 DIAGNOSIS — C61 Malignant neoplasm of prostate: Secondary | ICD-10-CM | POA: Diagnosis not present

## 2020-04-15 ENCOUNTER — Ambulatory Visit
Admission: RE | Admit: 2020-04-15 | Discharge: 2020-04-15 | Disposition: A | Discharge status: Home / Self Care | Payer: Medicare HMO | Source: Ambulatory Visit | Attending: Radiation Oncology | Admitting: Radiation Oncology

## 2020-04-15 DIAGNOSIS — C61 Malignant neoplasm of prostate: Secondary | ICD-10-CM | POA: Diagnosis not present

## 2020-04-15 DIAGNOSIS — Z510 Encounter for antineoplastic radiation therapy: Secondary | ICD-10-CM | POA: Diagnosis not present

## 2020-04-18 ENCOUNTER — Ambulatory Visit
Admission: RE | Admit: 2020-04-18 | Discharge: 2020-04-18 | Disposition: A | Discharge status: Home / Self Care | Payer: Medicare HMO | Source: Ambulatory Visit | Attending: Radiation Oncology | Admitting: Radiation Oncology

## 2020-04-18 DIAGNOSIS — Z510 Encounter for antineoplastic radiation therapy: Secondary | ICD-10-CM | POA: Diagnosis not present

## 2020-04-18 DIAGNOSIS — C61 Malignant neoplasm of prostate: Secondary | ICD-10-CM | POA: Diagnosis not present

## 2020-04-19 ENCOUNTER — Ambulatory Visit
Admission: RE | Admit: 2020-04-19 | Discharge: 2020-04-19 | Disposition: A | Discharge status: Home / Self Care | Payer: Medicare HMO | Source: Ambulatory Visit | Attending: Radiation Oncology | Admitting: Radiation Oncology

## 2020-04-19 DIAGNOSIS — Z510 Encounter for antineoplastic radiation therapy: Secondary | ICD-10-CM | POA: Diagnosis not present

## 2020-04-19 DIAGNOSIS — C61 Malignant neoplasm of prostate: Secondary | ICD-10-CM | POA: Diagnosis not present

## 2020-04-20 ENCOUNTER — Ambulatory Visit
Admission: RE | Admit: 2020-04-20 | Discharge: 2020-04-20 | Disposition: A | Discharge status: Home / Self Care | Payer: Medicare HMO | Source: Ambulatory Visit | Attending: Radiation Oncology | Admitting: Radiation Oncology

## 2020-04-20 DIAGNOSIS — Z510 Encounter for antineoplastic radiation therapy: Secondary | ICD-10-CM | POA: Diagnosis not present

## 2020-04-20 DIAGNOSIS — C61 Malignant neoplasm of prostate: Secondary | ICD-10-CM | POA: Diagnosis not present

## 2020-04-21 ENCOUNTER — Ambulatory Visit
Admission: RE | Admit: 2020-04-21 | Discharge: 2020-04-21 | Disposition: A | Discharge status: Home / Self Care | Payer: Medicare HMO | Source: Ambulatory Visit | Attending: Radiation Oncology | Admitting: Radiation Oncology

## 2020-04-21 DIAGNOSIS — Z510 Encounter for antineoplastic radiation therapy: Secondary | ICD-10-CM | POA: Diagnosis not present

## 2020-04-21 DIAGNOSIS — C61 Malignant neoplasm of prostate: Secondary | ICD-10-CM | POA: Diagnosis not present

## 2020-04-22 ENCOUNTER — Ambulatory Visit
Admission: RE | Admit: 2020-04-22 | Discharge: 2020-04-22 | Disposition: A | Discharge status: Home / Self Care | Payer: Medicare HMO | Source: Ambulatory Visit | Attending: Radiation Oncology | Admitting: Radiation Oncology

## 2020-04-22 DIAGNOSIS — C61 Malignant neoplasm of prostate: Secondary | ICD-10-CM | POA: Diagnosis not present

## 2020-04-22 DIAGNOSIS — Z510 Encounter for antineoplastic radiation therapy: Secondary | ICD-10-CM | POA: Diagnosis not present

## 2020-04-24 ENCOUNTER — Other Ambulatory Visit: Payer: Self-pay

## 2020-04-24 ENCOUNTER — Ambulatory Visit
Admission: RE | Admit: 2020-04-24 | Discharge: 2020-04-24 | Disposition: A | Discharge status: Home / Self Care | Payer: Medicare HMO | Source: Ambulatory Visit | Attending: Radiation Oncology | Admitting: Radiation Oncology

## 2020-04-24 DIAGNOSIS — C61 Malignant neoplasm of prostate: Secondary | ICD-10-CM | POA: Diagnosis not present

## 2020-04-24 DIAGNOSIS — Z510 Encounter for antineoplastic radiation therapy: Secondary | ICD-10-CM | POA: Diagnosis not present

## 2020-04-25 ENCOUNTER — Ambulatory Visit
Admission: RE | Admit: 2020-04-25 | Discharge: 2020-04-25 | Disposition: A | Discharge status: Home / Self Care | Payer: Medicare HMO | Source: Ambulatory Visit | Attending: Radiation Oncology | Admitting: Radiation Oncology

## 2020-04-25 ENCOUNTER — Other Ambulatory Visit: Payer: Self-pay

## 2020-04-25 DIAGNOSIS — Z510 Encounter for antineoplastic radiation therapy: Secondary | ICD-10-CM | POA: Diagnosis not present

## 2020-04-25 DIAGNOSIS — C61 Malignant neoplasm of prostate: Secondary | ICD-10-CM | POA: Diagnosis not present

## 2020-04-26 ENCOUNTER — Ambulatory Visit
Admission: RE | Admit: 2020-04-26 | Discharge: 2020-04-26 | Disposition: A | Discharge status: Home / Self Care | Payer: Medicare HMO | Source: Ambulatory Visit | Attending: Radiation Oncology | Admitting: Radiation Oncology

## 2020-04-26 DIAGNOSIS — Z510 Encounter for antineoplastic radiation therapy: Secondary | ICD-10-CM | POA: Diagnosis not present

## 2020-04-26 DIAGNOSIS — E782 Mixed hyperlipidemia: Secondary | ICD-10-CM | POA: Diagnosis not present

## 2020-04-26 DIAGNOSIS — C61 Malignant neoplasm of prostate: Secondary | ICD-10-CM | POA: Diagnosis not present

## 2020-04-26 DIAGNOSIS — I10 Essential (primary) hypertension: Secondary | ICD-10-CM | POA: Diagnosis not present

## 2020-04-26 DIAGNOSIS — R7301 Impaired fasting glucose: Secondary | ICD-10-CM | POA: Diagnosis not present

## 2020-04-27 ENCOUNTER — Ambulatory Visit
Admission: RE | Admit: 2020-04-27 | Discharge: 2020-04-27 | Disposition: A | Discharge status: Home / Self Care | Payer: Medicare HMO | Source: Ambulatory Visit | Attending: Radiation Oncology | Admitting: Radiation Oncology

## 2020-04-27 DIAGNOSIS — Z510 Encounter for antineoplastic radiation therapy: Secondary | ICD-10-CM | POA: Diagnosis not present

## 2020-04-27 DIAGNOSIS — C61 Malignant neoplasm of prostate: Secondary | ICD-10-CM | POA: Diagnosis not present

## 2020-05-02 ENCOUNTER — Ambulatory Visit
Admission: RE | Admit: 2020-05-02 | Discharge: 2020-05-02 | Disposition: A | Discharge status: Home / Self Care | Payer: Medicare HMO | Source: Ambulatory Visit | Attending: Radiation Oncology | Admitting: Radiation Oncology

## 2020-05-02 DIAGNOSIS — Z510 Encounter for antineoplastic radiation therapy: Secondary | ICD-10-CM | POA: Diagnosis not present

## 2020-05-02 DIAGNOSIS — C61 Malignant neoplasm of prostate: Secondary | ICD-10-CM | POA: Diagnosis not present

## 2020-05-02 DIAGNOSIS — E782 Mixed hyperlipidemia: Secondary | ICD-10-CM | POA: Diagnosis not present

## 2020-05-02 DIAGNOSIS — Z6831 Body mass index (BMI) 31.0-31.9, adult: Secondary | ICD-10-CM | POA: Diagnosis not present

## 2020-05-02 DIAGNOSIS — Z23 Encounter for immunization: Secondary | ICD-10-CM | POA: Diagnosis not present

## 2020-05-03 ENCOUNTER — Ambulatory Visit
Admission: RE | Admit: 2020-05-03 | Discharge: 2020-05-03 | Disposition: A | Discharge status: Home / Self Care | Payer: Medicare HMO | Source: Ambulatory Visit | Attending: Radiation Oncology | Admitting: Radiation Oncology

## 2020-05-03 DIAGNOSIS — Z510 Encounter for antineoplastic radiation therapy: Secondary | ICD-10-CM | POA: Diagnosis not present

## 2020-05-03 DIAGNOSIS — C61 Malignant neoplasm of prostate: Secondary | ICD-10-CM | POA: Diagnosis not present

## 2020-05-04 ENCOUNTER — Ambulatory Visit
Admission: RE | Admit: 2020-05-04 | Discharge: 2020-05-04 | Disposition: A | Discharge status: Home / Self Care | Payer: Medicare HMO | Source: Ambulatory Visit | Attending: Radiation Oncology | Admitting: Radiation Oncology

## 2020-05-04 DIAGNOSIS — C61 Malignant neoplasm of prostate: Secondary | ICD-10-CM | POA: Insufficient documentation

## 2020-05-04 DIAGNOSIS — Z510 Encounter for antineoplastic radiation therapy: Secondary | ICD-10-CM | POA: Insufficient documentation

## 2020-05-05 ENCOUNTER — Ambulatory Visit
Admission: RE | Admit: 2020-05-05 | Discharge: 2020-05-05 | Disposition: A | Discharge status: Home / Self Care | Payer: Medicare HMO | Source: Ambulatory Visit | Attending: Radiation Oncology | Admitting: Radiation Oncology

## 2020-05-05 DIAGNOSIS — C61 Malignant neoplasm of prostate: Secondary | ICD-10-CM | POA: Diagnosis not present

## 2020-05-05 DIAGNOSIS — Z510 Encounter for antineoplastic radiation therapy: Secondary | ICD-10-CM | POA: Diagnosis not present

## 2020-05-06 ENCOUNTER — Ambulatory Visit
Admission: RE | Admit: 2020-05-06 | Discharge: 2020-05-06 | Disposition: A | Discharge status: Home / Self Care | Payer: Medicare HMO | Source: Ambulatory Visit | Attending: Radiation Oncology | Admitting: Radiation Oncology

## 2020-05-06 ENCOUNTER — Encounter: Payer: Self-pay | Admitting: Urology

## 2020-05-06 ENCOUNTER — Encounter: Payer: Self-pay | Admitting: Medical Oncology

## 2020-05-06 ENCOUNTER — Ambulatory Visit: Payer: Medicare HMO

## 2020-05-06 DIAGNOSIS — C61 Malignant neoplasm of prostate: Secondary | ICD-10-CM | POA: Diagnosis not present

## 2020-05-06 DIAGNOSIS — Z510 Encounter for antineoplastic radiation therapy: Secondary | ICD-10-CM | POA: Diagnosis not present

## 2020-05-09 ENCOUNTER — Ambulatory Visit: Payer: Medicare HMO

## 2020-05-13 DIAGNOSIS — C61 Malignant neoplasm of prostate: Secondary | ICD-10-CM | POA: Diagnosis not present

## 2020-05-20 DIAGNOSIS — N5201 Erectile dysfunction due to arterial insufficiency: Secondary | ICD-10-CM | POA: Diagnosis not present

## 2020-05-20 DIAGNOSIS — C61 Malignant neoplasm of prostate: Secondary | ICD-10-CM | POA: Diagnosis not present

## 2020-06-06 ENCOUNTER — Telehealth: Payer: Self-pay

## 2020-06-06 ENCOUNTER — Other Ambulatory Visit: Payer: Self-pay

## 2020-06-06 ENCOUNTER — Encounter: Payer: Self-pay | Admitting: Urology

## 2020-06-06 NOTE — Telephone Encounter (Signed)
Spoke with patient in regards to telephone appointment with Marcello Fennel PA on 06/08/20 @ 9:30am. Patient verbalized understanding of appointment date and time. Meaningful use , AUA and prostate questions reviewed. TM

## 2020-06-06 NOTE — Progress Notes (Signed)
Patient has telephone follow-up with [name] Bruning PA. Patient states nocturia 3 times per night. Patient denies dysuria. Patient states having urgency and is able to hold urine. Patient states that his urine stream is strong and steady. Patient states that he is emptying his bladder completely. Patient states that he has a small amount of leakage. Patient denies having to push or strain. Patient states that he has had his follow-up with [facility].

## 2020-06-07 NOTE — Progress Notes (Signed)
  Radiation Oncology         [phone] ________________________________  Name: Joshua Walters MRN: [id number]  Date: 05/06/2020  DOB: 1951-02-01  End of Treatment Note  Diagnosis:   70 y.o. gentleman with biochemical recurrence of stage pT2cN0, Gleason 3+4 adenocarcinoma of the prostate with current PSA of 0.19.     Indication for treatment:  Curative, Prostatic Fossa Radiotherapy       Radiation treatment dates:   03/15/20 - 05/06/20  Site/dose:   The prostatic fossa was treated to 68.4 Gy in 38 fractions of 1.8 Gy  Beams/energy:   The prostatic fossa was treated using VMAT intensity modulated radiotherapy delivering 6 megavolt photons. Image guidance was performed with CB-CT studies prior to each fraction. He was immobilized with a body fix lower extremity mold.  Narrative: The patient tolerated radiation treatment relatively well with only mild urinary irritation with increased frequency, urgency and nocturia x4 per night.  He specifically denied dysuria, gross hematuria, straining to void, weak stream, incomplete emptying or incontinence.  He did experience some mild fatigue but denied any bowel issues or diarrhea.  Plan: The patient has completed radiation treatment. He will return to radiation oncology clinic for routine followup in one month. I advised him to call or return sooner if he has any questions or concerns related to his recovery or treatment. ________________________________  Artist Pais. Kathrynn Running, M.D.

## 2020-06-07 NOTE — Progress Notes (Signed)
Radiation Oncology         [phone] ________________________________  Name: Joshua Walters MRN: [id number]  Date: 06/08/2020  DOB: June 27, 1950  Post Treatment Note  CC: Curlene Labrum, MD  Raynelle Bring, MD  Diagnosis:   70 y.o. gentleman with biochemical recurrence of stage pT2cN0, Gleason 3+4 adenocarcinoma of the prostate with current PSA of 0.19.  Interval Since Last Radiation:  4.5 weeks  03/15/20 - 05/06/20:   The prostatic fossa was treated to 68.4 Gy in 38 fractions of 1.8 Gy, concurrent with short-term ADT  Narrative:  I spoke with the patient to conduct his routine scheduled 1 month follow up visit via telephone to spare the patient unnecessary potential exposure in the healthcare setting during the current COVID-19 pandemic.  The patient was notified in advance and gave permission to proceed with this visit format. He tolerated radiation treatment relatively well with only mild urinary irritation with increased frequency, urgency and nocturia x4 per night. He specifically denied dysuria, gross hematuria, straining to void, weak stream, incomplete emptying or incontinence.  He did experience some mild fatigue but denied any bowel issues or diarrhea.  He continued to tolerate ADT well throughout treatment.                            On review of systems, the patient states that he is doing well in general.  He continues with nocturia 2-3 times per night and slight increased urgency but otherwise feels that his LUTS have pretty much resolved and he is back to his baseline.  His current IPSS score is 5, indicating mild urinary symptoms.  He specifically denies dysuria, gross hematuria, weak stream, straining to void, incomplete bladder emptying or incontinence.  He reports a healthy appetite and is maintaining his weight.  He denies any significant fatigue at this point and has remained active.  He has had occasional mild hot flashes that have not been particularly bothersome.  He  denies abdominal pain, nausea, vomiting, or diarrhea but has experienced some mild constipation.  He is taking Metamucil daily as recommended by his gastroenterologist, for management of his diverticular disease and reports that this seems to be helping improve his regularity. We discussed increasing in his fluid intake as well, to help prevent constipation. Overall, he is quite pleased with his progress to date.  ALLERGIES:  is allergic to penicillins and sulfa antibiotics.  Meds: Current Outpatient Medications  Medication Sig Dispense Refill  . Cholecalciferol (VITAMIN D3) 1.25 MG (50000 UT) CAPS Take by mouth.    . famotidine (PEPCID) 20 MG tablet Take 20 mg by mouth 2 (two) times daily.    . Omega-3 1000 MG CAPS Take by mouth.    . rosuvastatin (CRESTOR) 20 MG tablet Take 20 mg by mouth at bedtime.    Marland Kitchen acetaminophen (TYLENOL) 500 MG tablet Take 1,000 mg by mouth every 6 (six) hours as needed for moderate pain. (Patient not taking: No sig reported)    . SHINGRIX injection  (Patient not taking: No sig reported)    . simvastatin (ZOCOR) 40 MG tablet  (Patient not taking: Reported on 06/06/2020)     No current facility-administered medications for this encounter.    Physical Findings:  vitals were not taken for this visit.   /Unable to assess due to telephone follow-up visit format.  Lab Findings: Lab Results  Component Value Date   WBC 6.8 03/03/2015   HGB 12.2 (L)  03/15/2015   HCT 37.8 (L) 03/15/2015   MCV 96.1 03/03/2015   PLT 228 03/03/2015     Radiographic Findings: No results found.  Impression/Plan: 1. 70 y.o. gentleman with biochemical recurrence of stage pT2cN0, Gleason 3+4 adenocarcinoma of the prostate with current PSA of 0.19. He will continue to follow up with urology for ongoing PSA determinations and had a recent follow-up appointment with Dr. Laverle Patter on 05/20/2020.  His PSA performed prior to that visit was undetectable which he is quite pleased with.  His next  scheduled follow-up is in July 2022 with Dr. Laverle Patter and he understands what to expect with regards to PSA monitoring going forward. I will look forward to following his response to treatment via correspondence with urology, and would be happy to continue to participate in his care if clinically indicated. I talked to the patient about what to expect in the future, including his risk for erectile dysfunction and rectal bleeding. I encouraged him to call or return to the office if he has any questions regarding his previous radiation or possible radiation side effects. He was comfortable with this plan and will follow up as needed.    Marguarite Arbour, PA-C

## 2020-06-08 ENCOUNTER — Ambulatory Visit
Admission: RE | Admit: 2020-06-08 | Discharge: 2020-06-08 | Disposition: A | Discharge status: Home / Self Care | Payer: Medicare HMO | Source: Ambulatory Visit | Attending: Urology | Admitting: Urology

## 2020-06-08 ENCOUNTER — Other Ambulatory Visit: Payer: Self-pay

## 2020-06-08 DIAGNOSIS — C61 Malignant neoplasm of prostate: Secondary | ICD-10-CM

## 2020-06-20 ENCOUNTER — Telehealth (HOSPITAL_BASED_OUTPATIENT_CLINIC_OR_DEPARTMENT_OTHER): Payer: Self-pay

## 2020-06-20 NOTE — Telephone Encounter (Signed)
Received PA request for entresto. Submitted through covermymeds.    Your request has been approved  CaseId:[id number];Status:Approved;Review Type:Prior Auth;Coverage Start Date:05/21/2020;Coverage End Date:06/20/2021;

## 2020-07-28 DIAGNOSIS — R7301 Impaired fasting glucose: Secondary | ICD-10-CM | POA: Diagnosis not present

## 2020-07-28 DIAGNOSIS — E782 Mixed hyperlipidemia: Secondary | ICD-10-CM | POA: Diagnosis not present

## 2020-07-28 DIAGNOSIS — E7849 Other hyperlipidemia: Secondary | ICD-10-CM | POA: Diagnosis not present

## 2020-07-28 DIAGNOSIS — I10 Essential (primary) hypertension: Secondary | ICD-10-CM | POA: Diagnosis not present

## 2020-08-02 DIAGNOSIS — I10 Essential (primary) hypertension: Secondary | ICD-10-CM | POA: Diagnosis not present

## 2020-08-02 DIAGNOSIS — E7849 Other hyperlipidemia: Secondary | ICD-10-CM | POA: Diagnosis not present

## 2020-08-02 DIAGNOSIS — C61 Malignant neoplasm of prostate: Secondary | ICD-10-CM | POA: Diagnosis not present

## 2020-08-02 DIAGNOSIS — Z9079 Acquired absence of other genital organ(s): Secondary | ICD-10-CM | POA: Diagnosis not present

## 2020-08-02 DIAGNOSIS — Z6830 Body mass index (BMI) 30.0-30.9, adult: Secondary | ICD-10-CM | POA: Diagnosis not present

## 2020-08-10 ENCOUNTER — Emergency Department: Payer: Medicare Other

## 2020-08-10 DIAGNOSIS — Z539 Procedure and treatment not carried out, unspecified reason: Secondary | ICD-10-CM | POA: Insufficient documentation

## 2020-08-10 LAB — MMC RAPID COVID-19 ANTIGEN: Rapid Covid-19 Antigen MMC: NEGATIVE

## 2020-08-10 NOTE — ED Triage Note (Signed)
Pt has had runny nose and productive cough for 3 days.  He states the color is greenish.  His roommate has recently been diagnosed with covid.  Pt has very cool purplish hands and fingers. Pt feels like there is fluid on his longs.

## 2020-08-10 NOTE — ED Nursing Note (Signed)
Xray attempted to call  Pt in lobby, not there.

## 2020-08-10 NOTE — ED Provider Notes (Shared)
EMERGENCY DEPARTMENT PHYSICIAN NOTE - [name]       Date of Service:   No admission date for patient encounter. Patient's PCP: Patient, No Pcp Per   Note Started: 08/10/2020 23:02 DOB: February 19, 1951             Chief Complaint   Patient presents with    Cough     The history provided by the patient and medical records.  Interpreter used: No    Bryan Martinez is a 70yr old male, with a past medical history significant for hypertension, hyperlipidemia, COPD, CAD s/p CABG, ischemic cardiomyopathy, systolic CHF (last EF 55 to 60%) s/p AICD, PAD, carotid stenosis, valvular heart disease with MVR, GERD, who presents to the ED with a chief complaint of a productive cough that began 3 days ago bringing up greenish phlegm and is worried because his roommate has Covid. ***  ***. Pt has had runny nose and productive cough for 3 days.  He states the color is greenish.  His roommate has recently been diagnosed with covid.  Pt has very cool purplish hands and fingers. Pt feels like there is fluid on his longs.     Quality: ***  Location: hip  Severity: 5/10  Time Course:  ED PAIN COURSE:950013  Progression:  ED PROGRESSION SINCE [id number]:[id number]  Duration: ***  Palliative factors:  ED RELIEVED BY:950007 makes it better.  Provocative factors:  ED WORSENED BY:950006 makes it worse.  Associated symptoms: see HPI   Pertinent negatives: see HPI and ROS    A full history, including pertinent past medical and social history was reviewed and updated as necessary.    HISTORY:  There are no active hospital problems to display for this patient.   Allergies   Allergen Reactions    Penicillins Hives      Past Medical History:  No date: Carotid stenosis  No date: Chronic anticoagulation  No date: Chronic systolic heart failure (HCC)  No date: COPD (chronic obstructive pulmonary disease) (HCC)  No date: Coronary artery disease  11/03/2015: ICD (implantable cardioverter-defibrillator) in place  No date: Ischemic  cardiomyopathy  No date: Mitral valve replaced  No date: PVD (peripheral vascular disease) (HCC)  11/18/2013: S/P CABG (coronary artery bypass graft) Past Surgical History:  11/18/2013: Cabg unspec # vessels - hx only  11/03/2015: Hc bi-v icd device   Social History    Socioeconomic History      Marital status: SINGLE      Spouse name: Not on file      Number of children: Not on file      Years of education: Not on file      Highest education level: Not on file    Occupational History      Not on file    Tobacco Use      Smoking status: Light Tobacco Smoker        Packs/day: 0.12        Types: Cigarettes      Smokeless tobacco: Current User      Tobacco comment: pt has information to quit "thinking about the patches not sure with doc if go well with my drugs or not"    Substance and Sexual Activity      Alcohol use: No      Drug use: No      Sexual activity: Not on file    Other Topics      Concerns:        Not on  file    Social History Narrative      Not on file    Social Determinants of Health     Financial Resource Strain: Not on file   Food Insecurity: Not on file   Transportation Needs: Not on file   Physical Activity: Not on file   Stress: Not on file   Social Connections: Not on file   Intimate Partner Violence: Not on file   Housing Stability: Not on file    Review of patient's family history indicates:  Problem: Heart Disease      Relation: Mother          Age of Onset: (Not Specified)  Problem: Heart Disease      Relation: Father          Age of Onset: (Not Specified)  Problem: Other (rhematoid arthritis)      Relation: Sister          Age of Onset: (Not Specified)  Problem: Cancer      Relation: Brother          Age of Onset: (Not Specified)          Comment: prostate       Physical Exam    Review of Systems    TRIAGE VITAL SIGNS:  Temp: 36.3 C (97.3 F) (08/10/20 2217)  Temp src: (not recorded)  Pulse: 94 (08/10/20 2217)  BP: 185/89 (08/10/20 2217)  Resp: 18 (08/10/20 2217)  SpO2: 97 % (08/10/20  2217)  Weight: 68 kg (150 lb) (08/10/20 2217)    INITIAL ASSESSMENT & PLAN, MEDICAL DECISION MAKING, ED COURSE  Bryan Martinez is a 69yr male who presents with a chief complaint of ***.     Differential includes, but is not limited to: ***    The results of the ED evaluation were notable for the following:     ED AMOUNT AND COMPLEXITY OF DATA REVIEWED:950033      Chart Review: I reviewed the patient's prior medical records. Pertinent information that is relevant to this encounter ***.      Patient Summary: ***      LAST VITAL SIGNS:  Temp: 36.3 C (97.3 F) (08/10/20 2217)  Temp src: (not recorded)  Pulse: 94 (08/10/20 2217)  BP: 185/89 (08/10/20 2217)  Resp: 18 (08/10/20 2217)  SpO2: 97 % (08/10/20 2217)  Weight: 68 kg (150 lb) (08/10/20 2217)         Clinical Impression: ***            Disposition:  DISPOSITION:950063      PATIENT'S GENERAL CONDITION:   [place] PATIENT CONDITION:9500100    Electronically signed by: Wilhemina Cash, MD

## 2020-08-10 NOTE — ED Nursing Note (Signed)
Pt waiting in his car.

## 2020-08-11 ENCOUNTER — Emergency Department
Admission: EM | Admit: 2020-08-11 | Discharge: 2020-08-11 | Discharge status: Against Medical Advice | Payer: Medicare Other | Attending: Emergency Medicine | Admitting: Emergency Medicine

## 2020-08-11 NOTE — ED Progress Note (Signed)
Bryan Martinez is a 70yr old male, with a past medical history significant for hypertension, hyperlipidemia, COPD, CAD s/p CABG, ischemic cardiomyopathy, systolic CHF (last EF 55 to 60%) s/p AICD, PAD, carotid stenosis, valvular heart disease with MVR, GERD, who presents to the ED with a chief complaint of a productive cough that began 3 days ago bringing up greenish phlegm and is worried because his roommate has Covid and he felt like he had a fluid on his lungs. He had runny nose and was noted to have very cool purplish hands and fingers.  A Covid screen was performed out of triage which was negative.  Because of his extensive CHF history and current symptoms a chest x-ray was ordered which was not done however and the patient subsequently left without being seen out of the lobby.

## 2020-08-11 NOTE — ED Nursing Note (Signed)
Had room 13 available for patient called him he is at home not in his car.  He wants to come in at a different time.

## 2020-08-17 ENCOUNTER — Telehealth (HOSPITAL_BASED_OUTPATIENT_CLINIC_OR_DEPARTMENT_OTHER): Payer: Self-pay

## 2020-08-17 NOTE — Telephone Encounter (Signed)
Needs refills of Entresto  And Flomax called into [facility] in [place].    Thank You   Mariel Kansky  Receptionist/ Scheduler   Cardiology and Pulmonology   [phone]

## 2020-08-18 MED ORDER — SACUBITRIL 97 MG-VALSARTAN 103 MG TABLET
1.00 | ORAL_TABLET | Freq: Two times a day (BID) | ORAL | 3 refills | Status: DC
Start: 2020-08-18 — End: 2020-08-22

## 2020-08-18 NOTE — Telephone Encounter (Signed)
Lov 10/13/19  Nov 08/22/20    Prepared entresto.  Flomax needs to go to PCP.

## 2020-08-21 NOTE — Progress Notes (Signed)
Bryan Martinez  DOB: 1951-03-26  MR#: [id number]    SUBJECTIVE    Bryan Martinez returns to the office for a cardiology visit. He is overdue for follow up. He has a history of hypertension, hyperlipidemia, COPD, CAD s/p CABG, ischemic cardiomyopathy, systolic CHF (last EF 55 to 60%) s/p AICD, PAD, carotid stenosis, valvular heart disease with MVR, GERD.  Since the last visit, he has remained stable from a cardiac perspective. However, he has been off all his meds for "at least a few months". States he needed refills and forgot to notify our office. Earlier this month, he was concerned that he had covid "my roommates had Covid and I was coughing". His PCR was negative.  Denies exertional chest pain. No SOB. No palpitations. No PND/orthopnea. No edema. Functional/exertional capacity stable.    CRT-D evaluation: Battery 2.2 years; AP 2.5% BP 93%; AMS <1% longest episode 39 minutes and event have picked up since non compliance with meds; no NSVT.     ECHO 9/21  Left Ventricle:   Normal left ventricular systolic function.   EF range is estimated at 55 % -60 %.   There is mild left ventricular hypertrophy.  Right Ventricle:   A pacing wire is present.  Mitral Valve:   Mild MV prosthesis regurgitation.   No prosthesis stenosis, but the mean gradient is with a HR of 101 BPM.  Aortic Valve:   Trace aortic regurgitation is present.  Tricuspid Valve:   Mild tricuspid regurgitation.   Estimated systolic pulmonary artery pressure is mildly increased.  Tricuspid Valve Measurements   RVSP: 40 mmHg.    PAST MEDICAL HISTORY  Past Medical History:   Diagnosis Date    Carotid stenosis     Chronic anticoagulation     Chronic systolic heart failure (HCC)     COPD (chronic obstructive pulmonary disease) (HCC)     Coronary artery disease     ICD (implantable cardioverter-defibrillator) in place 11/03/2015    Ischemic cardiomyopathy     Mitral valve replaced     PVD (peripheral vascular disease) (HCC)      S/P CABG (coronary artery bypass graft) 11/18/2013       SOCIAL HISTORY  Social History     Tobacco Use   Smoking Status Light Tobacco Smoker    Packs/day: 0.12    Types: Cigarettes   Smokeless Tobacco Current User   Tobacco Comment    pt has information to quit "thinking about the patches not sure with doc if go well with my drugs or not"     Social History     Substance and Sexual Activity   Alcohol Use No       ALLERGIES:    Penicillins    Hives    CURRENT MEDS:    Current Outpatient Medications   Medication Instructions    Albuterol (PROAIR HFA) 90 mcg/actuation inhaler 1-2 puffs, INHALATION, EVERY 4 TO 6 HOURS IF NEEDED    Aspirin 81 mg, ORAL, DAILY MORNING    Atorvastatin (LIPITOR) 20 mg, ORAL, DAILY AT BEDTIME    Carvedilol (COREG) 6.25 mg, ORAL, TWO TIMES DAILY WITH MEALS    Omeprazole (PRILOSEC) 20 mg, ORAL, DAILY MORNING    Sacubitril-Valsartan (ENTRESTO) 97-103 mg Tablet 1 tablet, ORAL, TWO TIMES DAILY       OBJECTIVE    Gastrointestinal General No change in bowel habits, no melena, no hematochezia, no nausea, vomiting or abdominal pain.   General/Constitutional  no fever or chills.  Respiratory See HPI no coughing, no wheezing.   Peripheral Vascular no edema.   Hematology/ Lymph General No bleeding or bruising, and no hematuria.   Cardiovascular See HPI No chest pain, SOB, syncope, presyncope, dizziness, lightheadedness, palpitations, racing heart beats, no orthopnea or PND.     ASSESSMENT     BP (!) 150/100 (SITE: left arm, Orthostatic Position: sitting, Cuff Size: regular)   Pulse 93   Ht 1.727 m (5\' 8" )   Wt 62.1 kg (137 lb)   SpO2 99%   BMI 20.83 kg/m     GENERAL::Alert, appropriate, in no acute distress, pleasant, calm, breathing easily on room air, Neck: No JVD in seated position, no bruits. CARDIOVASCULAR:: Regular rate and rhythm with 1/6 murmur LSB CHEST:: Lungs clear, respirations unlabored, no crackles or wheezes, normal excursion. SKIN:: Uncovered skin is pink warm dry and  intact. EXTREMITIES:: No edema.       PLAN    1. Chronic systolic heart failure (HCC)  Clinically stable, appears euvolemic. Resume Entresto and carvedilol.  Reinforced importance of medical therapy  - Comprehensive Metabolic Panel; Future    2. Coronary artery disease involving native coronary artery of native heart without angina pectoris  Stable, no s/s consistent with angina/equivalent    3. Benign essential HTN  Elevated; He will be resuming Entresto and carvedilol; Recheck BP on medical therapy in a few weeks  - Comprehensive Metabolic Panel; Future    4. ICD (implantable cardioverter-defibrillator) in place  Normal function; See scanned comprehensive report  - Comprehensive Metabolic Panel; Future  - CBC No Differential; Future  - Lipid Panel; Future    5. Mitral valve replaced  02/2020 Echo showed Mild MV prosthesis regurgitation. Recommend repeat echo in 6 months.    6. Hyperlipidemia, unspecified hyperlipidemia type  Resume atorvastatin  - Comprehensive Metabolic Panel; Future  - Lipid Panel; Future    Return in 2 weeks or sooner should the need arise. Plan of care discussed with Dr 03/2020. All questions and concerns addressed.     Aubery Lapping, FNP  [facility] Cardiology

## 2020-08-22 ENCOUNTER — Ambulatory Visit (HOSPITAL_BASED_OUTPATIENT_CLINIC_OR_DEPARTMENT_OTHER): Payer: Medicare Other | Admitting: Nurse Practitioner

## 2020-08-22 ENCOUNTER — Encounter (HOSPITAL_BASED_OUTPATIENT_CLINIC_OR_DEPARTMENT_OTHER): Payer: Self-pay

## 2020-08-22 VITALS — BP 150/100 | HR 93 | Ht 68.0 in | Wt 137.0 lb

## 2020-08-22 DIAGNOSIS — I10 Essential (primary) hypertension: Secondary | ICD-10-CM

## 2020-08-22 DIAGNOSIS — I5022 Chronic systolic (congestive) heart failure: Secondary | ICD-10-CM

## 2020-08-22 DIAGNOSIS — Z952 Presence of prosthetic heart valve: Secondary | ICD-10-CM

## 2020-08-22 DIAGNOSIS — E785 Hyperlipidemia, unspecified: Secondary | ICD-10-CM

## 2020-08-22 DIAGNOSIS — Z95810 Presence of automatic (implantable) cardiac defibrillator: Secondary | ICD-10-CM

## 2020-08-22 DIAGNOSIS — I2510 Atherosclerotic heart disease of native coronary artery without angina pectoris: Secondary | ICD-10-CM

## 2020-08-22 MED ORDER — SACUBITRIL 97 MG-VALSARTAN 103 MG TABLET
1.00 | ORAL_TABLET | Freq: Two times a day (BID) | ORAL | 6 refills | Status: DC
Start: 2020-08-22 — End: 2021-10-31

## 2020-08-22 MED ORDER — CARVEDILOL 6.25 MG TABLET: 6.2500 mg | ORAL_TABLET | Freq: Two times a day (BID) | ORAL | 6 refills | Status: DC

## 2020-08-22 MED ORDER — ATORVASTATIN 20 MG TABLET: 20.0000 mg | ORAL_TABLET | Freq: Every day | ORAL | 6 refills | Status: DC

## 2020-08-22 MED ORDER — TAMSULOSIN 0.4 MG CAPSULE: 0.4000 mg | ORAL_CAPSULE | Freq: Every day | ORAL | 11 refills | Status: DC

## 2020-09-04 NOTE — Progress Notes (Deleted)
Mccoy Testa  DOB: [birth year]  MR#: [id number]    SUBJECTIVE    [name] returns to the office for a cardiology visit. He has a history of hypertension, hyperlipidemia, COPD, CAD s/p CABG, ischemic cardiomyopathy, systolic CHF (last EF 55 to 60%) s/p AICD, PAD, carotid stenosis, valvular heart disease with MVR, GERD.  At the previous visit, noted he had been off his cardiac medications for at least a few months. His BP was high. He restarted on Entresto and carvedilol.       Since the last visit, he has remained stable from a cardiac perspective. However, he has been off all his meds for "at least a few months". States he needed refills and forgot to notify our office. Earlier this month, he was concerned that he had covid "my roommates had Covid and I was coughing". His PCR was negative.  Denies exertional chest pain. No SOB. No palpitations. No PND/orthopnea. No edema. Functional/exertional capacity stable.    CRT-D evaluation: Battery 2.2 years; AP 2.5% BP 93%; AMS <1% longest episode 39 minutes and event have picked up since non compliance with meds; no NSVT.     ECHO 9/21  Left Ventricle:   Normal left ventricular systolic function.   EF range is estimated at 55 % -60 %.   There is mild left ventricular hypertrophy.  Right Ventricle:   A pacing wire is present.  Mitral Valve:   Mild MV prosthesis regurgitation.   No prosthesis stenosis, but the mean gradient is with a HR of 101 BPM.  Aortic Valve:   Trace aortic regurgitation is present.  Tricuspid Valve:   Mild tricuspid regurgitation.   Estimated systolic pulmonary artery pressure is mildly increased.  Tricuspid Valve Measurements   RVSP: 40 mmHg.    PAST MEDICAL HISTORY  Past Medical History:   Diagnosis Date    Carotid stenosis     Chronic anticoagulation     Chronic systolic heart failure (HCC)     COPD (chronic obstructive pulmonary disease) (HCC)     Coronary artery disease     ICD (implantable  cardioverter-defibrillator) in place 11/03/2015    Ischemic cardiomyopathy     Mitral valve replaced     PVD (peripheral vascular disease) (HCC)     S/P CABG (coronary artery bypass graft) 11/18/2013       SOCIAL HISTORY  Social History     Tobacco Use   Smoking Status Light Tobacco Smoker    Packs/day: 0.12    Types: Cigarettes   Smokeless Tobacco Current User   Tobacco Comment    pt has information to quit "thinking about the patches not sure with doc if go well with my drugs or not"     Social History     Substance and Sexual Activity   Alcohol Use No       ALLERGIES:    Penicillins    Hives    CURRENT MEDS:    Current Outpatient Medications   Medication Instructions    Albuterol (PROAIR HFA) 90 mcg/actuation inhaler 1-2 puffs, INHALATION, EVERY 4 TO 6 HOURS IF NEEDED    Aspirin 81 mg, ORAL, DAILY MORNING    Atorvastatin (LIPITOR) 20 mg, ORAL, DAILY AT BEDTIME    Carvedilol (COREG) 6.25 mg, ORAL, TWO TIMES DAILY WITH MEALS    Omeprazole (PRILOSEC) 20 mg, ORAL, DAILY MORNING    Sacubitril-Valsartan (ENTRESTO) 97-103 mg Tablet 1 tablet, ORAL, TWO TIMES DAILY       OBJECTIVE  Gastrointestinal General No change in bowel habits, no melena, no hematochezia, no nausea, vomiting or abdominal pain.   General/Constitutional  no fever or chills.   Respiratory See HPI no coughing, no wheezing.   Peripheral Vascular no edema.   Hematology/ Lymph General No bleeding or bruising, and no hematuria.   Cardiovascular See HPI No chest pain, SOB, syncope, presyncope, dizziness, lightheadedness, palpitations, racing heart beats, no orthopnea or PND.     ASSESSMENT     There were no vitals taken for this visit.    GENERAL::Alert, appropriate, in no acute distress, pleasant, calm, breathing easily on room air, Neck: No JVD in seated position, no bruits. CARDIOVASCULAR:: Regular rate and rhythm with 1/6 murmur LSB CHEST:: Lungs clear, respirations unlabored, no crackles or wheezes, normal excursion. SKIN:: Uncovered skin  is pink warm dry and intact. EXTREMITIES:: No edema.       PLAN    1. Chronic systolic heart failure (HCC)  Clinically stable, appears euvolemic. Resume Entresto and carvedilol.  Reinforced importance of medical therapy  - Comprehensive Metabolic Panel; Future    2. Coronary artery disease involving native coronary artery of native heart without angina pectoris  Stable, no s/s consistent with angina/equivalent    3. Benign essential HTN  Elevated; He will be resuming Entresto and carvedilol; Recheck BP on medical therapy in a few weeks  - Comprehensive Metabolic Panel; Future    4. ICD (implantable cardioverter-defibrillator) in place  Normal function; See scanned comprehensive report  - Comprehensive Metabolic Panel; Future  - CBC No Differential; Future  - Lipid Panel; Future    5. Mitral valve replaced  02/2020 Echo showed Mild MV prosthesis regurgitation. Recommend repeat echo in 6 months.    6. Hyperlipidemia, unspecified hyperlipidemia type  Resume atorvastatin  - Comprehensive Metabolic Panel; Future  - Lipid Panel; Future    Return in 2 weeks or sooner should the need arise. Plan of care discussed with Dr Aubery Lapping. All questions and concerns addressed.     Roslyn Smiling, FNP  [facility]

## 2020-09-06 ENCOUNTER — Ambulatory Visit (HOSPITAL_BASED_OUTPATIENT_CLINIC_OR_DEPARTMENT_OTHER): Payer: Self-pay

## 2020-09-29 DIAGNOSIS — Z23 Encounter for immunization: Secondary | ICD-10-CM | POA: Diagnosis not present

## 2020-11-09 DIAGNOSIS — Z947 Corneal transplant status: Secondary | ICD-10-CM | POA: Diagnosis not present

## 2020-11-09 DIAGNOSIS — H25813 Combined forms of age-related cataract, bilateral: Secondary | ICD-10-CM | POA: Diagnosis not present

## 2020-11-09 DIAGNOSIS — H53001 Unspecified amblyopia, right eye: Secondary | ICD-10-CM | POA: Diagnosis not present

## 2020-12-06 DIAGNOSIS — C61 Malignant neoplasm of prostate: Secondary | ICD-10-CM | POA: Diagnosis not present

## 2020-12-13 DIAGNOSIS — C61 Malignant neoplasm of prostate: Secondary | ICD-10-CM | POA: Diagnosis not present

## 2020-12-28 ENCOUNTER — Telehealth (HOSPITAL_BASED_OUTPATIENT_CLINIC_OR_DEPARTMENT_OTHER): Payer: Self-pay | Admitting: Cardiovascular Disease

## 2020-12-28 NOTE — Telephone Encounter (Signed)
Received fax from [facility] requesting Dr. Aubery Lapping sign authorization for pt to have genetic testing done.    I called and lvm with pt asking him if this is something he wants done. Asked him to call back.

## 2020-12-30 NOTE — Telephone Encounter (Signed)
Left another message regarding genetics testing.    I will scan orders into pt's chart and if he wants to have this done, he will give [place] a call back and we'll go from there.

## 2021-01-03 NOTE — Telephone Encounter (Signed)
[employer] called asking for an update on the form. I let her know that I have called the pt to see if this is something he wants done and then I will ask Dr. Aubery Lapping if he is willing to sign the form. In the past, when we've gotten requests for this patient, Dr. Aubery Lapping has stated genetic testing is not recommended by him. (See TE from 09/03/19)    She said she will call the pt and have him call us.

## 2021-01-27 DIAGNOSIS — R7301 Impaired fasting glucose: Secondary | ICD-10-CM | POA: Diagnosis not present

## 2021-01-27 DIAGNOSIS — E7849 Other hyperlipidemia: Secondary | ICD-10-CM | POA: Diagnosis not present

## 2021-01-27 DIAGNOSIS — I10 Essential (primary) hypertension: Secondary | ICD-10-CM | POA: Diagnosis not present

## 2021-01-27 DIAGNOSIS — Z1329 Encounter for screening for other suspected endocrine disorder: Secondary | ICD-10-CM | POA: Diagnosis not present

## 2021-01-27 DIAGNOSIS — E782 Mixed hyperlipidemia: Secondary | ICD-10-CM | POA: Diagnosis not present

## 2021-02-02 DIAGNOSIS — Z9079 Acquired absence of other genital organ(s): Secondary | ICD-10-CM | POA: Diagnosis not present

## 2021-02-02 DIAGNOSIS — Z1389 Encounter for screening for other disorder: Secondary | ICD-10-CM | POA: Diagnosis not present

## 2021-02-02 DIAGNOSIS — R7301 Impaired fasting glucose: Secondary | ICD-10-CM | POA: Diagnosis not present

## 2021-02-02 DIAGNOSIS — Z0001 Encounter for general adult medical examination with abnormal findings: Secondary | ICD-10-CM | POA: Diagnosis not present

## 2021-02-02 DIAGNOSIS — C61 Malignant neoplasm of prostate: Secondary | ICD-10-CM | POA: Diagnosis not present

## 2021-02-02 DIAGNOSIS — E7849 Other hyperlipidemia: Secondary | ICD-10-CM | POA: Diagnosis not present

## 2021-02-02 DIAGNOSIS — Z1331 Encounter for screening for depression: Secondary | ICD-10-CM | POA: Diagnosis not present

## 2021-02-02 DIAGNOSIS — I10 Essential (primary) hypertension: Secondary | ICD-10-CM | POA: Diagnosis not present

## 2021-02-10 DIAGNOSIS — M75102 Unspecified rotator cuff tear or rupture of left shoulder, not specified as traumatic: Secondary | ICD-10-CM | POA: Diagnosis not present

## 2021-02-10 DIAGNOSIS — Z6829 Body mass index (BMI) 29.0-29.9, adult: Secondary | ICD-10-CM | POA: Diagnosis not present

## 2021-02-10 DIAGNOSIS — M19012 Primary osteoarthritis, left shoulder: Secondary | ICD-10-CM | POA: Diagnosis not present

## 2021-02-17 DIAGNOSIS — M75102 Unspecified rotator cuff tear or rupture of left shoulder, not specified as traumatic: Secondary | ICD-10-CM | POA: Diagnosis not present

## 2021-02-17 DIAGNOSIS — M25612 Stiffness of left shoulder, not elsewhere classified: Secondary | ICD-10-CM | POA: Diagnosis not present

## 2021-02-17 DIAGNOSIS — R29898 Other symptoms and signs involving the musculoskeletal system: Secondary | ICD-10-CM | POA: Diagnosis not present

## 2021-02-24 DIAGNOSIS — R29898 Other symptoms and signs involving the musculoskeletal system: Secondary | ICD-10-CM | POA: Diagnosis not present

## 2021-02-24 DIAGNOSIS — M75102 Unspecified rotator cuff tear or rupture of left shoulder, not specified as traumatic: Secondary | ICD-10-CM | POA: Diagnosis not present

## 2021-02-24 DIAGNOSIS — M25612 Stiffness of left shoulder, not elsewhere classified: Secondary | ICD-10-CM | POA: Diagnosis not present

## 2021-03-01 DIAGNOSIS — M25612 Stiffness of left shoulder, not elsewhere classified: Secondary | ICD-10-CM | POA: Diagnosis not present

## 2021-03-01 DIAGNOSIS — R29898 Other symptoms and signs involving the musculoskeletal system: Secondary | ICD-10-CM | POA: Diagnosis not present

## 2021-03-01 DIAGNOSIS — M75102 Unspecified rotator cuff tear or rupture of left shoulder, not specified as traumatic: Secondary | ICD-10-CM | POA: Diagnosis not present

## 2021-03-08 DIAGNOSIS — R29898 Other symptoms and signs involving the musculoskeletal system: Secondary | ICD-10-CM | POA: Diagnosis not present

## 2021-03-08 DIAGNOSIS — M25612 Stiffness of left shoulder, not elsewhere classified: Secondary | ICD-10-CM | POA: Diagnosis not present

## 2021-03-08 DIAGNOSIS — M75102 Unspecified rotator cuff tear or rupture of left shoulder, not specified as traumatic: Secondary | ICD-10-CM | POA: Diagnosis not present

## 2021-03-10 DIAGNOSIS — R29898 Other symptoms and signs involving the musculoskeletal system: Secondary | ICD-10-CM | POA: Diagnosis not present

## 2021-03-10 DIAGNOSIS — M25612 Stiffness of left shoulder, not elsewhere classified: Secondary | ICD-10-CM | POA: Diagnosis not present

## 2021-03-10 DIAGNOSIS — M75102 Unspecified rotator cuff tear or rupture of left shoulder, not specified as traumatic: Secondary | ICD-10-CM | POA: Diagnosis not present

## 2021-03-15 DIAGNOSIS — M25612 Stiffness of left shoulder, not elsewhere classified: Secondary | ICD-10-CM | POA: Diagnosis not present

## 2021-03-15 DIAGNOSIS — M75102 Unspecified rotator cuff tear or rupture of left shoulder, not specified as traumatic: Secondary | ICD-10-CM | POA: Diagnosis not present

## 2021-03-15 DIAGNOSIS — R29898 Other symptoms and signs involving the musculoskeletal system: Secondary | ICD-10-CM | POA: Diagnosis not present

## 2021-03-17 DIAGNOSIS — R29898 Other symptoms and signs involving the musculoskeletal system: Secondary | ICD-10-CM | POA: Diagnosis not present

## 2021-03-17 DIAGNOSIS — M75102 Unspecified rotator cuff tear or rupture of left shoulder, not specified as traumatic: Secondary | ICD-10-CM | POA: Diagnosis not present

## 2021-03-17 DIAGNOSIS — M25612 Stiffness of left shoulder, not elsewhere classified: Secondary | ICD-10-CM | POA: Diagnosis not present

## 2021-03-20 DIAGNOSIS — Z23 Encounter for immunization: Secondary | ICD-10-CM | POA: Diagnosis not present

## 2021-03-21 DIAGNOSIS — M19012 Primary osteoarthritis, left shoulder: Secondary | ICD-10-CM | POA: Diagnosis not present

## 2021-03-21 DIAGNOSIS — M75102 Unspecified rotator cuff tear or rupture of left shoulder, not specified as traumatic: Secondary | ICD-10-CM | POA: Diagnosis not present

## 2021-03-21 DIAGNOSIS — Z6828 Body mass index (BMI) 28.0-28.9, adult: Secondary | ICD-10-CM | POA: Diagnosis not present

## 2021-03-23 DIAGNOSIS — M75102 Unspecified rotator cuff tear or rupture of left shoulder, not specified as traumatic: Secondary | ICD-10-CM | POA: Diagnosis not present

## 2021-03-23 DIAGNOSIS — R29898 Other symptoms and signs involving the musculoskeletal system: Secondary | ICD-10-CM | POA: Diagnosis not present

## 2021-03-23 DIAGNOSIS — M25612 Stiffness of left shoulder, not elsewhere classified: Secondary | ICD-10-CM | POA: Diagnosis not present

## 2021-03-24 DIAGNOSIS — M25612 Stiffness of left shoulder, not elsewhere classified: Secondary | ICD-10-CM | POA: Diagnosis not present

## 2021-03-24 DIAGNOSIS — R29898 Other symptoms and signs involving the musculoskeletal system: Secondary | ICD-10-CM | POA: Diagnosis not present

## 2021-03-24 DIAGNOSIS — M75102 Unspecified rotator cuff tear or rupture of left shoulder, not specified as traumatic: Secondary | ICD-10-CM | POA: Diagnosis not present

## 2021-03-29 DIAGNOSIS — R29898 Other symptoms and signs involving the musculoskeletal system: Secondary | ICD-10-CM | POA: Diagnosis not present

## 2021-03-29 DIAGNOSIS — M25612 Stiffness of left shoulder, not elsewhere classified: Secondary | ICD-10-CM | POA: Diagnosis not present

## 2021-03-29 DIAGNOSIS — M75102 Unspecified rotator cuff tear or rupture of left shoulder, not specified as traumatic: Secondary | ICD-10-CM | POA: Diagnosis not present

## 2021-04-05 DIAGNOSIS — M25612 Stiffness of left shoulder, not elsewhere classified: Secondary | ICD-10-CM | POA: Diagnosis not present

## 2021-04-05 DIAGNOSIS — M75102 Unspecified rotator cuff tear or rupture of left shoulder, not specified as traumatic: Secondary | ICD-10-CM | POA: Diagnosis not present

## 2021-04-05 DIAGNOSIS — R29898 Other symptoms and signs involving the musculoskeletal system: Secondary | ICD-10-CM | POA: Diagnosis not present

## 2021-04-07 DIAGNOSIS — M25612 Stiffness of left shoulder, not elsewhere classified: Secondary | ICD-10-CM | POA: Diagnosis not present

## 2021-04-07 DIAGNOSIS — M75102 Unspecified rotator cuff tear or rupture of left shoulder, not specified as traumatic: Secondary | ICD-10-CM | POA: Diagnosis not present

## 2021-04-07 DIAGNOSIS — R29898 Other symptoms and signs involving the musculoskeletal system: Secondary | ICD-10-CM | POA: Diagnosis not present

## 2021-04-11 DIAGNOSIS — M75102 Unspecified rotator cuff tear or rupture of left shoulder, not specified as traumatic: Secondary | ICD-10-CM | POA: Diagnosis not present

## 2021-04-11 DIAGNOSIS — R29898 Other symptoms and signs involving the musculoskeletal system: Secondary | ICD-10-CM | POA: Diagnosis not present

## 2021-04-11 DIAGNOSIS — M25612 Stiffness of left shoulder, not elsewhere classified: Secondary | ICD-10-CM | POA: Diagnosis not present

## 2021-04-14 DIAGNOSIS — M25612 Stiffness of left shoulder, not elsewhere classified: Secondary | ICD-10-CM | POA: Diagnosis not present

## 2021-04-14 DIAGNOSIS — R29898 Other symptoms and signs involving the musculoskeletal system: Secondary | ICD-10-CM | POA: Diagnosis not present

## 2021-04-14 DIAGNOSIS — M75102 Unspecified rotator cuff tear or rupture of left shoulder, not specified as traumatic: Secondary | ICD-10-CM | POA: Diagnosis not present

## 2021-04-19 DIAGNOSIS — R29898 Other symptoms and signs involving the musculoskeletal system: Secondary | ICD-10-CM | POA: Diagnosis not present

## 2021-04-19 DIAGNOSIS — M75102 Unspecified rotator cuff tear or rupture of left shoulder, not specified as traumatic: Secondary | ICD-10-CM | POA: Diagnosis not present

## 2021-04-19 DIAGNOSIS — M25612 Stiffness of left shoulder, not elsewhere classified: Secondary | ICD-10-CM | POA: Diagnosis not present

## 2021-04-21 DIAGNOSIS — M75102 Unspecified rotator cuff tear or rupture of left shoulder, not specified as traumatic: Secondary | ICD-10-CM | POA: Diagnosis not present

## 2021-04-21 DIAGNOSIS — R29898 Other symptoms and signs involving the musculoskeletal system: Secondary | ICD-10-CM | POA: Diagnosis not present

## 2021-04-21 DIAGNOSIS — M25612 Stiffness of left shoulder, not elsewhere classified: Secondary | ICD-10-CM | POA: Diagnosis not present

## 2021-04-24 DIAGNOSIS — R29898 Other symptoms and signs involving the musculoskeletal system: Secondary | ICD-10-CM | POA: Diagnosis not present

## 2021-04-24 DIAGNOSIS — M25612 Stiffness of left shoulder, not elsewhere classified: Secondary | ICD-10-CM | POA: Diagnosis not present

## 2021-04-24 DIAGNOSIS — M75102 Unspecified rotator cuff tear or rupture of left shoulder, not specified as traumatic: Secondary | ICD-10-CM | POA: Diagnosis not present

## 2021-05-03 DIAGNOSIS — M25612 Stiffness of left shoulder, not elsewhere classified: Secondary | ICD-10-CM | POA: Diagnosis not present

## 2021-05-03 DIAGNOSIS — M75102 Unspecified rotator cuff tear or rupture of left shoulder, not specified as traumatic: Secondary | ICD-10-CM | POA: Diagnosis not present

## 2021-05-03 DIAGNOSIS — R29898 Other symptoms and signs involving the musculoskeletal system: Secondary | ICD-10-CM | POA: Diagnosis not present

## 2021-05-05 DIAGNOSIS — M75102 Unspecified rotator cuff tear or rupture of left shoulder, not specified as traumatic: Secondary | ICD-10-CM | POA: Diagnosis not present

## 2021-05-05 DIAGNOSIS — M25612 Stiffness of left shoulder, not elsewhere classified: Secondary | ICD-10-CM | POA: Diagnosis not present

## 2021-05-05 DIAGNOSIS — R29898 Other symptoms and signs involving the musculoskeletal system: Secondary | ICD-10-CM | POA: Diagnosis not present

## 2021-05-10 DIAGNOSIS — M75102 Unspecified rotator cuff tear or rupture of left shoulder, not specified as traumatic: Secondary | ICD-10-CM | POA: Diagnosis not present

## 2021-05-10 DIAGNOSIS — M25612 Stiffness of left shoulder, not elsewhere classified: Secondary | ICD-10-CM | POA: Diagnosis not present

## 2021-05-10 DIAGNOSIS — R29898 Other symptoms and signs involving the musculoskeletal system: Secondary | ICD-10-CM | POA: Diagnosis not present

## 2021-05-12 DIAGNOSIS — M75102 Unspecified rotator cuff tear or rupture of left shoulder, not specified as traumatic: Secondary | ICD-10-CM | POA: Diagnosis not present

## 2021-05-12 DIAGNOSIS — S46012A Strain of muscle(s) and tendon(s) of the rotator cuff of left shoulder, initial encounter: Secondary | ICD-10-CM | POA: Diagnosis not present

## 2021-05-12 DIAGNOSIS — X58XXXA Exposure to other specified factors, initial encounter: Secondary | ICD-10-CM | POA: Diagnosis not present

## 2021-05-12 DIAGNOSIS — M62512 Muscle wasting and atrophy, not elsewhere classified, left shoulder: Secondary | ICD-10-CM | POA: Diagnosis not present

## 2021-05-12 DIAGNOSIS — S46812A Strain of other muscles, fascia and tendons at shoulder and upper arm level, left arm, initial encounter: Secondary | ICD-10-CM | POA: Diagnosis not present

## 2021-05-12 DIAGNOSIS — M75122 Complete rotator cuff tear or rupture of left shoulder, not specified as traumatic: Secondary | ICD-10-CM | POA: Diagnosis not present

## 2021-05-12 DIAGNOSIS — M25612 Stiffness of left shoulder, not elsewhere classified: Secondary | ICD-10-CM | POA: Diagnosis not present

## 2021-05-12 DIAGNOSIS — R29898 Other symptoms and signs involving the musculoskeletal system: Secondary | ICD-10-CM | POA: Diagnosis not present

## 2021-05-17 DIAGNOSIS — M25612 Stiffness of left shoulder, not elsewhere classified: Secondary | ICD-10-CM | POA: Diagnosis not present

## 2021-05-17 DIAGNOSIS — M75102 Unspecified rotator cuff tear or rupture of left shoulder, not specified as traumatic: Secondary | ICD-10-CM | POA: Diagnosis not present

## 2021-05-17 DIAGNOSIS — R29898 Other symptoms and signs involving the musculoskeletal system: Secondary | ICD-10-CM | POA: Diagnosis not present

## 2021-05-18 DIAGNOSIS — M75102 Unspecified rotator cuff tear or rupture of left shoulder, not specified as traumatic: Secondary | ICD-10-CM | POA: Diagnosis not present

## 2021-05-18 DIAGNOSIS — R29898 Other symptoms and signs involving the musculoskeletal system: Secondary | ICD-10-CM | POA: Diagnosis not present

## 2021-05-18 DIAGNOSIS — M19012 Primary osteoarthritis, left shoulder: Secondary | ICD-10-CM | POA: Diagnosis not present

## 2021-05-18 DIAGNOSIS — Z6828 Body mass index (BMI) 28.0-28.9, adult: Secondary | ICD-10-CM | POA: Diagnosis not present

## 2021-05-19 DIAGNOSIS — M75102 Unspecified rotator cuff tear or rupture of left shoulder, not specified as traumatic: Secondary | ICD-10-CM | POA: Diagnosis not present

## 2021-05-19 DIAGNOSIS — M25612 Stiffness of left shoulder, not elsewhere classified: Secondary | ICD-10-CM | POA: Diagnosis not present

## 2021-05-19 DIAGNOSIS — R29898 Other symptoms and signs involving the musculoskeletal system: Secondary | ICD-10-CM | POA: Diagnosis not present

## 2021-05-23 DIAGNOSIS — M25612 Stiffness of left shoulder, not elsewhere classified: Secondary | ICD-10-CM | POA: Diagnosis not present

## 2021-05-23 DIAGNOSIS — M75102 Unspecified rotator cuff tear or rupture of left shoulder, not specified as traumatic: Secondary | ICD-10-CM | POA: Diagnosis not present

## 2021-05-23 DIAGNOSIS — R29898 Other symptoms and signs involving the musculoskeletal system: Secondary | ICD-10-CM | POA: Diagnosis not present

## 2021-05-24 DIAGNOSIS — M25512 Pain in left shoulder: Secondary | ICD-10-CM | POA: Diagnosis not present

## 2021-05-24 DIAGNOSIS — R29898 Other symptoms and signs involving the musculoskeletal system: Secondary | ICD-10-CM | POA: Diagnosis not present

## 2021-05-31 DIAGNOSIS — M75102 Unspecified rotator cuff tear or rupture of left shoulder, not specified as traumatic: Secondary | ICD-10-CM | POA: Diagnosis not present

## 2021-05-31 DIAGNOSIS — M25612 Stiffness of left shoulder, not elsewhere classified: Secondary | ICD-10-CM | POA: Diagnosis not present

## 2021-05-31 DIAGNOSIS — R29898 Other symptoms and signs involving the musculoskeletal system: Secondary | ICD-10-CM | POA: Diagnosis not present

## 2021-06-02 DIAGNOSIS — R29898 Other symptoms and signs involving the musculoskeletal system: Secondary | ICD-10-CM | POA: Diagnosis not present

## 2021-06-02 DIAGNOSIS — M25612 Stiffness of left shoulder, not elsewhere classified: Secondary | ICD-10-CM | POA: Diagnosis not present

## 2021-06-02 DIAGNOSIS — M75102 Unspecified rotator cuff tear or rupture of left shoulder, not specified as traumatic: Secondary | ICD-10-CM | POA: Diagnosis not present

## 2021-06-07 DIAGNOSIS — M25612 Stiffness of left shoulder, not elsewhere classified: Secondary | ICD-10-CM | POA: Diagnosis not present

## 2021-06-07 DIAGNOSIS — M75102 Unspecified rotator cuff tear or rupture of left shoulder, not specified as traumatic: Secondary | ICD-10-CM | POA: Diagnosis not present

## 2021-06-07 DIAGNOSIS — R29898 Other symptoms and signs involving the musculoskeletal system: Secondary | ICD-10-CM | POA: Diagnosis not present

## 2021-06-14 ENCOUNTER — Encounter: Payer: Self-pay | Admitting: Neurology

## 2021-06-14 ENCOUNTER — Other Ambulatory Visit: Payer: Self-pay

## 2021-06-14 DIAGNOSIS — R29898 Other symptoms and signs involving the musculoskeletal system: Secondary | ICD-10-CM | POA: Diagnosis not present

## 2021-06-14 DIAGNOSIS — M75102 Unspecified rotator cuff tear or rupture of left shoulder, not specified as traumatic: Secondary | ICD-10-CM | POA: Diagnosis not present

## 2021-06-14 DIAGNOSIS — M25612 Stiffness of left shoulder, not elsewhere classified: Secondary | ICD-10-CM | POA: Diagnosis not present

## 2021-06-14 DIAGNOSIS — R202 Paresthesia of skin: Secondary | ICD-10-CM

## 2021-06-21 DIAGNOSIS — M25612 Stiffness of left shoulder, not elsewhere classified: Secondary | ICD-10-CM | POA: Diagnosis not present

## 2021-06-21 DIAGNOSIS — M75102 Unspecified rotator cuff tear or rupture of left shoulder, not specified as traumatic: Secondary | ICD-10-CM | POA: Diagnosis not present

## 2021-06-21 DIAGNOSIS — R29898 Other symptoms and signs involving the musculoskeletal system: Secondary | ICD-10-CM | POA: Diagnosis not present

## 2021-07-04 ENCOUNTER — Ambulatory Visit: Payer: Medicare HMO | Admitting: Neurology

## 2021-07-04 ENCOUNTER — Other Ambulatory Visit: Payer: Self-pay

## 2021-07-04 DIAGNOSIS — G5612 Other lesions of median nerve, left upper limb: Secondary | ICD-10-CM

## 2021-07-04 DIAGNOSIS — G5622 Lesion of ulnar nerve, left upper limb: Secondary | ICD-10-CM

## 2021-07-04 DIAGNOSIS — R202 Paresthesia of skin: Secondary | ICD-10-CM | POA: Diagnosis not present

## 2021-07-04 NOTE — Procedures (Signed)
[facility]  [address]  [place], [place] [address] Tel: [phone] Fax:  [phone] Test Date:  07/04/2021  Patient: Joshua Walters DOB: 1951/01/07 Physician: Narda Amber, DO  Sex: Male Height: 6\' 1"  Ref Phys: Judithann Sauger, DO  ID#: [id number]   Technician:    Patient Complaints: This is a 71 year old man referred for evaluation of left hand paresthesias and weakness.  NCV & EMG Findings: Extensive electrodiagnostic testing of the left upper shows:  Left median and ulnar sensory responses show prolonged latency (L6.0, L3.7 ms) with normal amplitude.  Left radial, medial antebrachial, and lateral antebrachial cutaneous sensory responses are within normal limits. Left median motor response shows severely prolonged latency (6.5 ms), reduced amplitude (0.8 mV), and decreased conduction velocity (Elbow-Wrist, 33 m/s).  Left ulnar motor response shows severely prolonged latency (5.6 ms), reduced amplitude (4.5 mV), decreased conduction velocity along the course of the nerve (33-38 m/s), and conduction block in the forearm.  There is no temporal dispersion.  Left radial motor response is within normal limits. Chronic motor axonal loss changes are seen affecting the distal median and ulnar innervated muscles with active denervation in the first dorsal interosseous and abductor pollicis brevis muscles.  Despite maximal activation, no motor unit recruitment is seen in the abductor pollicis brevis muscle.  Proximal median and ulnar-innervated muscles showed normal motor unit recruitment and configuration.  There are no fibrillation potentials seen in the cervical paraspinal muscles.  Impression: The electrophysiologic findings are consistent with a severe left median and ulnar neuropathy, with axonal and demyelinating features.  A polyneuropathy seems less likely with normal radial sensory response.  Neurological consultation for further evaluation is recommended. There  is no evidence of a brachial plexopathy or cervical radiculopathy.   ___________________________ Narda Amber, DO    Nerve Conduction Studies Anti Sensory Summary Table   Stim Site NR Peak (ms) Norm Peak (ms) P-T Amp (V) Norm P-T Amp  Left Lat Ante Brach Cutan Anti Sensory (Lat Forearm)  32C  Lat Biceps    2.3 <2.8 11.8 >10  Left Med Ante Brach Cutan Anti Sensory (Med Forearm)  32C  Elbow    2.7  11.9   Left Median Anti Sensory (2nd Digit)  32C  Wrist    6.0 <3.8 12.8 >10  Left Radial Anti Sensory (Base 1st Digit)  32C  Wrist    2.7 <2.8 22.9 >10  Left Ulnar Anti Sensory (5th Digit)  32C  Wrist    3.7 <3.2 11.1 >5   Motor Summary Table   Stim Site NR Onset (ms) Norm Onset (ms) O-P Amp (mV) Norm O-P Amp Site1 Site2 Delta-0 (ms) Dist (cm) Vel (m/s) Norm Vel (m/s)  Left Median Motor (Abd Poll Brev)  32C  Wrist    6.5 <4.0 0.8 >5 Elbow Wrist 9.6 32.0 33 >50  Elbow    16.1  0.6         Left Radial Motor (Ext Ind Prop)  32C  7cm    3.0 <3.1 6.0 >5 ACF 7cm 1.5 9.0 60 >50  ACF    4.5  5.8  B-SG ACF 1.4 9.0 64   B-SG    5.9  5.6  A-SG B-SG 0.4 3.0 75   A-SG    6.3  5.3         Left Ulnar Motor (Abd Dig Minimi)  32C  Wrist    5.6 <3.1 4.5 >7 B Elbow Wrist 6.0 23.0 38 >50  B Elbow  11.6  3.5  A Elbow B Elbow 3.0 10.0 33 >50  A Elbow    14.6  3.5          EMG   Side Muscle Ins Act Fibs Psw Fasc Number Recrt Dur Dur. Amp Amp. Poly Poly. Comment  Left Abd Poll Brev Nml 1+ Nml Nml NE None - - - - - - ATR  Left 1stDorInt Nml 1+ Nml Nml SMU Rapid All 1+ All 2+ All 2+ ATR  Left FlexPolLong Nml Nml Nml Nml 2- Rapid All 1+ All 1+ All 1+ N/A  Left ABD Dig Min Nml Nml Nml Nml 3- Rapid All 1+ All 2+ All 2+ ATR  Left Ext Indicis Nml Nml Nml Nml Nml Nml Nml Nml Nml Nml Nml Nml N/A  Left PronatorTeres Nml Nml Nml Nml Nml Nml Nml Nml Nml Nml Nml Nml N/A  Left Triceps Nml Nml Nml Nml Nml Nml Nml Nml Nml Nml Nml Nml N/A  Left Deltoid Nml Nml Nml Nml Nml Nml Nml Nml Nml Nml Nml Nml N/A   Left Infraspinatus Nml Nml Nml Nml Nml Nml Nml Nml Nml Nml Nml Nml N/A  Left Cervical Parasp Low Nml Nml Nml Nml Nml Nml Nml Nml Nml Nml Nml Nml N/A  Left Biceps Nml Nml Nml Nml Nml Nml Nml Nml Nml Nml Nml Nml N/A  Left FlexCarRad Nml Nml Nml Nml Nml Nml Nml Nml Nml Nml Nml Nml N/A      Waveforms:

## 2021-07-10 DIAGNOSIS — R29898 Other symptoms and signs involving the musculoskeletal system: Secondary | ICD-10-CM | POA: Diagnosis not present

## 2021-07-13 DIAGNOSIS — C61 Malignant neoplasm of prostate: Secondary | ICD-10-CM | POA: Diagnosis not present

## 2021-07-14 DIAGNOSIS — C61 Malignant neoplasm of prostate: Secondary | ICD-10-CM | POA: Diagnosis not present

## 2021-07-21 DIAGNOSIS — N5201 Erectile dysfunction due to arterial insufficiency: Secondary | ICD-10-CM | POA: Diagnosis not present

## 2021-07-21 DIAGNOSIS — C61 Malignant neoplasm of prostate: Secondary | ICD-10-CM | POA: Diagnosis not present

## 2021-07-31 DIAGNOSIS — E782 Mixed hyperlipidemia: Secondary | ICD-10-CM | POA: Diagnosis not present

## 2021-07-31 DIAGNOSIS — R7301 Impaired fasting glucose: Secondary | ICD-10-CM | POA: Diagnosis not present

## 2021-07-31 DIAGNOSIS — I10 Essential (primary) hypertension: Secondary | ICD-10-CM | POA: Diagnosis not present

## 2021-07-31 DIAGNOSIS — E7849 Other hyperlipidemia: Secondary | ICD-10-CM | POA: Diagnosis not present

## 2021-08-14 DIAGNOSIS — I10 Essential (primary) hypertension: Secondary | ICD-10-CM | POA: Diagnosis not present

## 2021-08-14 DIAGNOSIS — R7301 Impaired fasting glucose: Secondary | ICD-10-CM | POA: Diagnosis not present

## 2021-08-14 DIAGNOSIS — G6289 Other specified polyneuropathies: Secondary | ICD-10-CM | POA: Diagnosis not present

## 2021-08-14 DIAGNOSIS — C61 Malignant neoplasm of prostate: Secondary | ICD-10-CM | POA: Diagnosis not present

## 2021-08-14 DIAGNOSIS — M75102 Unspecified rotator cuff tear or rupture of left shoulder, not specified as traumatic: Secondary | ICD-10-CM | POA: Diagnosis not present

## 2021-08-14 DIAGNOSIS — M19012 Primary osteoarthritis, left shoulder: Secondary | ICD-10-CM | POA: Diagnosis not present

## 2021-08-14 DIAGNOSIS — R29898 Other symptoms and signs involving the musculoskeletal system: Secondary | ICD-10-CM | POA: Diagnosis not present

## 2021-08-14 DIAGNOSIS — Z9079 Acquired absence of other genital organ(s): Secondary | ICD-10-CM | POA: Diagnosis not present

## 2021-08-15 DIAGNOSIS — Z0001 Encounter for general adult medical examination with abnormal findings: Secondary | ICD-10-CM | POA: Diagnosis not present

## 2021-08-15 DIAGNOSIS — F17210 Nicotine dependence, cigarettes, uncomplicated: Secondary | ICD-10-CM | POA: Diagnosis not present

## 2021-08-15 DIAGNOSIS — I10 Essential (primary) hypertension: Secondary | ICD-10-CM | POA: Diagnosis not present

## 2021-08-15 DIAGNOSIS — Z6827 Body mass index (BMI) 27.0-27.9, adult: Secondary | ICD-10-CM | POA: Diagnosis not present

## 2021-08-15 DIAGNOSIS — R69 Illness, unspecified: Secondary | ICD-10-CM | POA: Diagnosis not present

## 2021-08-15 DIAGNOSIS — E7849 Other hyperlipidemia: Secondary | ICD-10-CM | POA: Diagnosis not present

## 2021-08-29 NOTE — Progress Notes (Signed)
?[facility] ?Neurology Division ?Clinic Note - Initial Visit ? ? ?Date: 08/30/21 ? ?Joshua Walters ?MRN: [id number] ?DOB: 12-02-1950 ? ? ?Dear Dr. Sheliah Plane: ? ?Thank you for your kind referral of Joshua Walters for consultation of left hand weakness. Although his history is well known to you, please allow [place] to reiterate it for the purpose of our medical record. The patient was accompanied to the clinic by wife and grand daughter who also provides collateral information.  ?  ? ?History of Present Illness: ?Joshua Walters is a 71 y.o. right-handed male with GERD, hypertension, and hyperlipidemia presenting for evaluation of left hand weakness.  ? ?Starting in September 2022, he developed left shoulder pain, which lasted about a week.  MRI left shoulder showed a complete tear of the supraspinatus tendon and small full thickness tear of the anterior aspect of the infraspinatus tendon.  He was doing PT for shoulder and during this time noticed weakness and atophy of the left hand.  He did not have sudden weakness of the hand.  He recalls also having reduced sensation in the hand, no tingling or pain.  No neck pain or radicular symptoms.  He was referred for EMG which shows severe left median and ulnar neuropathy, with demyelinating and axonal features, nonlocalizable.  He was referred here for further evaluation. He feels that since the EMG, the strength in his hand has improved.  He is able to use his hand better, such as tying shoes, manipulating zippers/buttons, and using yard tools.   ? ?Out-side paper records, electronic medical record, and images have been reviewed where available and summarized as:  ?NCS/EMG of the left arm 07/04/2021: ?The electrophysiologic findings are consistent with a severe left median and ulnar neuropathy, with axonal and demyelinating features.  A polyneuropathy seems less likely with normal radial sensory response.  Neurological consultation for further evaluation is  recommended. ?There is no evidence of a brachial plexopathy or cervical radiculopathy. ?  ? ?Past Medical History:  ?Diagnosis Date  ? Hypertension   ? Kidney stones   ? hx of   ? Pneumonia   ? hx of   ? Prostate cancer ([place])   ? ? ?Past Surgical History:  ?Procedure Laterality Date  ? APPENDECTOMY    ? bilateral corneal transplants     ? HERNIA REPAIR    ? inguinal hernia on right x 2 last one with mesh   ? LYMPHADENECTOMY Bilateral 03/14/2015  ? Procedure: PELVIC LYMPHADENECTOMY;  Surgeon: Raynelle Bring, MD;  Location: [facility];  Service: Urology;  Laterality: Bilateral;  ? ROBOT ASSISTED LAPAROSCOPIC RADICAL PROSTATECTOMY N/A 03/14/2015  ? Procedure: ROBOTIC ASSISTED LAPAROSCOPIC RADICAL PROSTATECTOMY LEVEL 2;  Surgeon: Raynelle Bring, MD;  Location: [facility];  Service: Urology;  Laterality: N/A;  ? TONSILLECTOMY    ? and adenoidectomy   ? ? ? ?Medications:  ?Outpatient Encounter Medications as of 08/30/2021  ?Medication Sig  ? Cholecalciferol (VITAMIN D3) 25 MCG (1000 UT) CAPS Take by mouth once.  ? famotidine (PEPCID) 20 MG tablet Take 20 mg by mouth 2 (two) times daily.  ? Omega-3 1000 MG CAPS Take by mouth.  ? rosuvastatin (CRESTOR) 20 MG tablet Take 20 mg by mouth at bedtime.  ? acetaminophen (TYLENOL) 500 MG tablet Take 1,000 mg by mouth every 6 (six) hours as needed for moderate pain. (Patient not taking: Reported on 12/15/2019)  ? lisinopril (ZESTRIL) 10 MG tablet Take 10 mg by mouth daily.  ? SHINGRIX injection  (Patient not  taking: Reported on 12/15/2019)  ? simvastatin (ZOCOR) 40 MG tablet  (Patient not taking: Reported on 06/06/2020)  ? ?No facility-administered encounter medications on file as of 08/30/2021.  ? ? ?Allergies:  ?Allergies  ?Allergen Reactions  ? Penicillins   ?  Has patient had a PCN reaction causing immediate rash, facial/tongue/throat swelling, SOB or lightheadedness with hypotension ?Has patient had a PCN reaction causing severe rash involving mucus membranes or skin necrosis: Yes- Rash ?Has  patient had a PCN reaction that required hospitalization No ?Has patient had a PCN reaction occurring within the last 10 years: No ?If all of the above answers are "NO", then may proceed with Cephalosporin use. ?  ? Sulfa Antibiotics Itching and Rash  ?  Blood pressure dropped  ? ? ?Family History: ?Family History  ?Problem Relation Age of Onset  ? Colon cancer Mother   ? Diabetes Mother   ? Lymphoma Father   ? Heart attack Father   ?     Bypass surgery  ? Multiple myeloma Sister   ? Cancer Maternal Aunt   ?     type unknown  ? Prostate cancer Neg Hx   ? Breast cancer Neg Hx   ? Pancreatic cancer Neg Hx   ? ? ?Social History: ?Social History  ? ?Tobacco Use  ? Smoking status: Former  ?  Packs/day: 2.00  ?  Years: 25.00  ?  Pack years: 50.00  ?  Types: Cigarettes  ?  Quit date: 06/05/2003  ?  Years since quitting: 18.2  ? Smokeless tobacco: Never  ?Vaping Use  ? Vaping Use: Never used  ?Substance Use Topics  ? Alcohol use: Yes  ?  Alcohol/week: 21.0 standard drinks  ?  Types: 21 Cans of beer per week  ?  Comment: Occasional Beer  ? Drug use: No  ? ?Social History  ? ?Social History Narrative  ? Raising Granddaughter Joshua Walters - Has custody   ? Right Handed   ? Lives in a one story home  ? ? ?Vital Signs:  ?BP (!) 186/83   Pulse 90   Ht 6' 1.5" (1.867 m)   Wt 207 lb (93.9 kg)   SpO2 99%   BMI 26.94 kg/m?  ? ?Neurological Exam: ?MENTAL STATUS including orientation to time, place, person, recent and remote memory, attention span and concentration, language, and fund of knowledge is normal.  Speech is not dysarthric. ? ?CRANIAL NERVES: ?II:  No visual field defects.    ?III-IV-VI: Pupils equal round and reactive to light.  Normal conjugate, extra-ocular eye movements in all directions of gaze.  No nystagmus.  No ptosis.   ?V:  Normal facial sensation.    ?VII:  Normal facial symmetry and movements.   ?VIII:  Normal hearing and vestibular function.   ?IX-X:  Normal palatal movement.   ?XI:  Normal shoulder shrug and head  rotation.   ?XII:  Normal tongue strength and range of motion, no deviation or fasciculation. ? ?MOTOR: Moderate left intrinsic hand muscle and forearm atrophy.  No fasciculations or abnormal movements.  No pronator drift.  ? ?Upper Extremity:  Right  Left  ?Deltoid  5/5   5/5   ?Biceps  5/5   5/5   ?Triceps  5/5   5/5   ?Infraspinatus 5/5  5/5  ?Medial pectoralis 5/5  5/5  ?Wrist extensors  5/5   5/5   ?Wrist flexors  5/5   5/5   ?Finger extensors  5/5   4/5   ?  Finger flexors  5/5   4/5   ?Dorsal interossei  5/5   4/5   ?Abductor pollicis  5/5   4/5   ?Tone (Ashworth scale)  0  0  ? ?Lower Extremity:  Right  Left  ?Hip flexors  5/5   5/5   ?Hip extensors  5/5   5/5   ?Adductor 5/5  5/5  ?Abductor 5/5  5/5  ?Knee flexors  5/5   5/5   ?Knee extensors  5/5   5/5   ?Dorsiflexors  5/5   5/5   ?Plantarflexors  5/5   5/5   ?Toe extensors  5/5   5/5   ?Toe flexors  5/5   5/5   ?Tone (Ashworth scale)  0  0  ? ?MSRs:  ?Right        Left                  ?brachioradialis 2+  2+  ?biceps 2+  2+  ?triceps 2+  2+  ?patellar 2+  2+  ?ankle jerk 2+  2+  ?Hoffman no  no  ?plantar response down  down  ? ?SENSORY:  Normal and symmetric perception of light touch, pinprick, vibration, and proprioception.  ? ?COORDINATION/GAIT: Normal finger-to- nose-finger.  Intact rapid alternating movements bilaterally.  Gait is narrow based, stable, unassisted.  Stressed gait intact.  Unsteady with tandem gait. ? ?IMPRESSION: ?Left arm weakness and atrophy due to multiple neuropathies involving the left median and ulnar nerves.  There is presence of demyelinating and axonal changes which raises concern for an acquired process.  Absence of severe pain makes mononeuritis multiplex unlikely.  No findings to suggest brachial plexopathy.  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  To better understand the nature of his asymmetric and subacute  neuropathy, I will check labs for autoimmune disease, nutritional deficiency, and other metabolic conditions.  CSF testing may also be indicated going forward.  ? ?PLAN/RECOMMENDATIONS:  ?Check ESR, CRP, ANA, vitamin B12,

## 2021-08-30 ENCOUNTER — Other Ambulatory Visit: Payer: Self-pay

## 2021-08-30 ENCOUNTER — Other Ambulatory Visit (INDEPENDENT_AMBULATORY_CARE_PROVIDER_SITE_OTHER): Payer: Medicare HMO

## 2021-08-30 ENCOUNTER — Ambulatory Visit: Payer: Medicare HMO | Admitting: Neurology

## 2021-08-30 ENCOUNTER — Encounter: Payer: Self-pay | Admitting: Neurology

## 2021-08-30 ENCOUNTER — Telehealth: Payer: Self-pay

## 2021-08-30 VITALS — BP 186/83 | HR 90 | Ht 73.5 in | Wt 207.0 lb

## 2021-08-30 DIAGNOSIS — G629 Polyneuropathy, unspecified: Secondary | ICD-10-CM

## 2021-08-30 LAB — B12 AND FOLATE PANEL
Folate: 11.1 ng/mL (ref >5.9)
Vitamin B-12: 273 pg/mL (ref 211–911)

## 2021-08-30 LAB — C-REACTIVE PROTEIN: CRP: <1 mg/dL (ref 0.5–20.0)

## 2021-08-30 LAB — SEDIMENTATION RATE: Sed Rate: 17 mm/hr (ref 0–20)

## 2021-08-30 LAB — TSH: TSH: 0.9 u[IU]/mL (ref 0.35–5.50)

## 2021-08-30 NOTE — Telephone Encounter (Signed)
Called patient and informed him that no radiation to his prostate would not cause his issues that he is having. Patient verbalized understanding and had no further questions or concerns. ?

## 2021-08-30 NOTE — Patient Instructions (Signed)
Check labs ? ?We will update you next week to determine if we need to proceed with lumbar puncture ? ? ?

## 2021-08-30 NOTE — Telephone Encounter (Signed)
No, radiation to his prostate would not cause this.  ?

## 2021-08-30 NOTE — Telephone Encounter (Signed)
Patient stated he forgot to mention something to Dr. Posey Pronto in his visit. Patient wants to know if him having radiation treatment for his prostate cancer, if any of that could be causing his arm weakness?  ?

## 2021-09-05 ENCOUNTER — Other Ambulatory Visit: Payer: Self-pay

## 2021-09-05 DIAGNOSIS — G629 Polyneuropathy, unspecified: Secondary | ICD-10-CM

## 2021-09-06 LAB — PROTEIN ELECTROPHORESIS, SERUM
Albumin ELP: 4.7 g/dL (ref 3.8–4.8)
Alpha 1: 0.3 g/dL (ref 0.2–0.3)
Alpha 2: 0.6 g/dL (ref 0.5–0.9)
Beta 2: 0.3 g/dL (ref 0.2–0.5)
Beta Globulin: 0.5 g/dL (ref 0.4–0.6)
Gamma Globulin: 0.9 g/dL (ref 0.8–1.7)
Total Protein: 7.4 g/dL (ref 6.1–8.1)

## 2021-09-06 LAB — SJOGREN'S SYNDROME ANTIBODS(SSA + SSB)
SSA (Ro) (ENA) Antibody, IgG: <1 AI
SSB (La) (ENA) Antibody, IgG: <1 AI

## 2021-09-06 LAB — COPPER, SERUM: Copper: 86 ug/dL (ref 70–175)

## 2021-09-06 LAB — CRYOGLOBULIN

## 2021-09-06 LAB — ANA: Anti Nuclear Antibody (ANA): NEGATIVE

## 2021-09-06 LAB — IMMUNOFIXATION ELECTROPHORESIS
IgG (Immunoglobin G), Serum: 929 mg/dL (ref 600–1540)
IgM, Serum: 49 mg/dL — ABNORMAL LOW (ref 50–300)
Immunofix Electr Int: NOT DETECTED
Immunoglobulin A: 144 mg/dL (ref 70–320)

## 2021-09-06 LAB — VITAMIN B1: Vitamin B1 (Thiamine): 7 nmol/L — ABNORMAL LOW (ref 8–30)

## 2021-09-06 LAB — ANCA SCREEN W REFLEX TITER: ANCA SCREEN: NEGATIVE

## 2021-09-13 ENCOUNTER — Telehealth: Payer: Self-pay | Admitting: Neurology

## 2021-09-13 NOTE — Telephone Encounter (Signed)
Pt's wife called in wanting to see if he could get his B12 levels up and wait on the spinal tap, or if we could get his PCP to give him an injection of B12 to get them up faster? Wants Dr. Serita Grit opinion. ?

## 2021-09-14 NOTE — Telephone Encounter (Signed)
His vitamin B12 levels are not low enough to get injections.OK to take oral supplementation vitamin B12 1048mg daily.  I still recommend getting spinal tap, but patient can schedule this when ready to proceed. ?

## 2021-09-14 NOTE — Telephone Encounter (Signed)
Returned call to patients wife. Left a message for a call back.  ?

## 2021-09-15 ENCOUNTER — Telehealth: Payer: Self-pay | Admitting: Neurology

## 2021-09-15 NOTE — Telephone Encounter (Signed)
Called patients wife and informed her of Dr. Serita Grit recommendations. Provided patients wife with [facility] phone number [phone] to give them a call to schedule his lumbar puncture when he is ready. Patients wife had no further questions or concerns.  ?

## 2021-09-15 NOTE — Telephone Encounter (Signed)
She previous encounter.  

## 2021-09-15 NOTE — Telephone Encounter (Signed)
Pts wife called in and left a message with the access nurse. She was returning a call to [facility] ?

## 2021-09-26 NOTE — Discharge Instructions (Signed)

## 2021-09-27 ENCOUNTER — Ambulatory Visit
Admission: RE | Admit: 2021-09-27 | Discharge: 2021-09-27 | Disposition: A | Discharge status: Home / Self Care | Payer: Medicare HMO | Source: Ambulatory Visit | Attending: Neurology | Admitting: Neurology

## 2021-09-27 DIAGNOSIS — G629 Polyneuropathy, unspecified: Secondary | ICD-10-CM

## 2021-09-27 NOTE — Progress Notes (Signed)
1 vial of blood drawn from pts RAC to be sent off with LP lab work. 1 successful attempt. Pt tolerated well. Gauze and tape applied after.  ?

## 2021-10-06 ENCOUNTER — Telehealth: Payer: Self-pay | Admitting: Neurology

## 2021-10-06 NOTE — Telephone Encounter (Signed)
Patient's wife called to speak with [facility] ? ?She is now aware the results are not ready yet. ?

## 2021-10-06 NOTE — Telephone Encounter (Signed)
No answer at 10:55am, results are not resulted yet by provider  ?

## 2021-10-06 NOTE — Telephone Encounter (Signed)
Patients wife would like to know if the results are in for the spinal tap. ?

## 2021-10-11 LAB — CSF CELL COUNT WITH DIFFERENTIAL
RBC Count, CSF: 0 cells/uL
TOTAL NUCLEATED CELL: 0 cells/uL (ref 0–5)

## 2021-10-11 LAB — OLIGOCLONAL BANDS, CSF + SERM

## 2021-10-11 LAB — CNS IGG SYNTHESIS RATE, CSF+BLOOD
Albumin Serum: 4.4 g/dL (ref 3.6–5.1)
Albumin, CSF: 31.9 mg/dL (ref 8.0–42.0)
CNS-IgG Synthesis Rate: 0.1 mg/24 h (ref <=3.3)
IgG (Immunoglobin G), Serum: 779 mg/dL (ref 600–1540)
IgG Total CSF: 3.1 mg/dL (ref 0.8–7.7)
IgG-Index: 0.55 (ref <0.70)

## 2021-10-11 LAB — GLUCOSE, CSF: Glucose, CSF: 60 mg/dL (ref 40–80)

## 2021-10-11 LAB — PROTEIN, CSF: Total Protein, CSF: 52 mg/dL (ref 15–60)

## 2021-10-11 LAB — ANGIOTENSIN CONVERTING ENZYME, CSF: ANGIOTENSIN CONVERTING ENZYME ( ACE) CSF: 2 U/L (ref <=15)

## 2021-10-27 ENCOUNTER — Ambulatory Visit (HOSPITAL_BASED_OUTPATIENT_CLINIC_OR_DEPARTMENT_OTHER): Payer: Medicare Other | Admitting: Gerontology

## 2021-10-27 ENCOUNTER — Encounter (HOSPITAL_BASED_OUTPATIENT_CLINIC_OR_DEPARTMENT_OTHER): Payer: Self-pay | Admitting: Gerontology

## 2021-10-27 ENCOUNTER — Telehealth (HOSPITAL_BASED_OUTPATIENT_CLINIC_OR_DEPARTMENT_OTHER): Payer: Self-pay | Admitting: Cardiovascular Disease

## 2021-10-27 ENCOUNTER — Other Ambulatory Visit (HOSPITAL_BASED_OUTPATIENT_CLINIC_OR_DEPARTMENT_OTHER): Payer: Self-pay | Admitting: Nurse Practitioner

## 2021-10-27 VITALS — BP 124/82 | HR 84 | Resp 20 | Wt 153.0 lb

## 2021-10-27 DIAGNOSIS — E785 Hyperlipidemia, unspecified: Secondary | ICD-10-CM

## 2021-10-27 DIAGNOSIS — I5022 Chronic systolic (congestive) heart failure: Secondary | ICD-10-CM

## 2021-10-27 DIAGNOSIS — Z95810 Presence of automatic (implantable) cardiac defibrillator: Secondary | ICD-10-CM

## 2021-10-27 MED ORDER — FUROSEMIDE 20 MG TABLET
20.00 mg | ORAL_TABLET | Freq: Every day | ORAL | 11 refills | Status: AC | PRN
Start: 2021-10-27 — End: 2022-10-22

## 2021-10-27 MED ORDER — SACUBITRIL 97 MG-VALSARTAN 103 MG TABLET
1.00 | ORAL_TABLET | Freq: Two times a day (BID) | ORAL | 3 refills | Status: AC
Start: 2021-10-27 — End: 2022-10-22

## 2021-10-27 MED ORDER — CARVEDILOL 6.25 MG TABLET: 6.2500 mg | ORAL_TABLET | Freq: Two times a day (BID) | ORAL | 3 refills | Status: DC

## 2021-10-27 MED ORDER — TAMSULOSIN 0.4 MG CAPSULE: 0.4000 mg | ORAL_CAPSULE | Freq: Every day | ORAL | 11 refills | Status: DC

## 2021-10-27 MED ORDER — ATORVASTATIN 20 MG TABLET: 20.0000 mg | ORAL_TABLET | Freq: Every day | ORAL | 3 refills | Status: DC

## 2021-10-27 NOTE — Telephone Encounter (Signed)
Pt called in SOB and dizziness more often. I tried to call him back to get more info. He is scheduled today with DB.

## 2021-10-27 NOTE — Patient Instructions (Signed)
YOu have some fluid in your lungs      Start lasix 20 mg daily for the next week until I see you again    Go to lab next week    Pick up your prescriptions and start today

## 2021-10-27 NOTE — Progress Notes (Signed)
[place] FOLLOW-UP VISIT    Patient Name: Bryan Martinez Date of service: 10/27/2021   DOB: 27-Oct-1950 BP:[id number], No Pcp Per, MA   MRN: [id number]         REASON FOR VISIT / INTERVAL HISTORY:    Bryan Martinez  is a 82 yr here for unscheduled Cardiology visit for concerns of  increase in DOE, cough and wheezing.   He was last seen in our office 08/2020. He is followed by Dr. Louie Bun for PMH of hypertension, hyperlipidemia, COPD, CAD s/p CABG, ischemic cardiomyopathy, systolic CHF (last EF 105 to 60%) s/p AICD, PAD, carotid stenosis, valvular heart disease with MVR, GERD.  Since the last visit, he states he has remained stable from a cardiac perspective until the last few weeks.  HE is now out of his medications and needs refills. He states he has only been without Entresto for 2-3 days.      Denies exertional chest pain.  No palpitations. No PND/orthopnea. No edema.   3 lb weight gain  CURRENT MEDICATIONS:    Current Outpatient Medications on File Prior to Visit   Medication Sig Dispense Refill    Albuterol (PROAIR HFA) 90 mcg/actuation inhaler Take 1-2 puffs by inhalation every 4 to 6 hours if needed for wheezing. 8.5 g 6    Aspirin 81 mg EC Tablet Take 1 tablet by mouth every morning. 30 tablet 0    Coenzyme Q10 (CO Q-10) 10 mg Capsule Take by mouth 2 times daily.      KRILL OIL PO Take by mouth.      misc electrolyte replacement (MAGNESIUM, MG++,) by IV route.      Omeprazole (PRILOSEC) 20 mg Delayed Release Capsule Take 1 capsule by mouth every morning. 30 capsule 0     No current facility-administered medications on file prior to visit.       REVIEW OF SYSTEMS:  Constitutional: Negative for activity change, appetite change, chills, fatigue and unexpected weight change.     See hpI      PHYSICAL EXAM:  BP 124/82 (SITE: left arm, Orthostatic Position: sitting, Cuff Size: regular)   Pulse 84   Resp 20   Wt 69.4 kg (153 lb)   SpO2 97%   BMI 23.26 kg/m     ICD interrogation shows Ap 1.4 % Bp 95%   no VT/ no VF. No SVT episodes.  28% to ERI,  mode switch <1%, AT/aF burden < 1 %  Reviewed by Dr. Louie Bun   General Examination:     GENERAL: alert, cooperative, no acute distress  .   NECK jugular venous distention (JVD) elevated    .   CARDIOVASCULAR: regular rate and rhythm, 1/6 murmur  .   CHEST: normal shape and expansion  .   RESPIRATORY: clear to auscultation bilaterally, bilateral  wheezes,  bibasilar  rales  .   GASTROINTESTINAL: bowel sounds: normal, soft  .   SKIN: warm and dry  .   EXTREMITIES: no clubbing, cyanosis or edema    .       Lab Results   Lab Name Value Date/Time    NA 137 06/25/2019 04:06 AM    K 3.8 06/25/2019 04:06 AM    CL 102 06/25/2019 04:06 AM    CO2 25 06/25/2019 04:06 AM    BUN 39 (H) 06/25/2019 04:06 AM    CR 1.57 (H) 06/25/2019 04:06 AM    GLU 113 (H) 06/25/2019 04:06 AM     Lab Results  Lab Name Value Date/Time    WBC 10.1 06/25/2019 03:33 AM    HGB 13.6 06/25/2019 03:33 AM    HCT 42.1 06/25/2019 03:33 AM    PLT 304 06/25/2019 03:33 AM     No results found for: CHOL, LDLC, HDL, TRIG          ALLERGIES:  Allergies   Allergen Reactions    Penicillins Hives           PAST MEDICAL HISTORY:  I have reviewed PMH    ASSESSMENT/PLAN:  1. Chronic systolic heart failure ([place])    2. ICD (implantable cardioverter-defibrillator) in place    3. Hyperlipidemia, unspecified hyperlipidemia type      CHF:  Pt shows signs of CHf exacerbation. He is not acutely ill. He is without Entresto 97/102 for 3 days. Will add lasix 20mg  daily for 5 days and reassess next week. I have refilled his Coreg, Entresto, Lipitor  I have asked him to get routine lab work done.  I will order echo and schedule DR. Louie Bun out to re eval in 6-8 weeks    2. ICD;  Interrogation shows no concerning arrhythmias    3. HLD;  on statin. Recheck lipid panel.  He states only missed a few doses    Morley Kos, NP

## 2021-10-27 NOTE — Telephone Encounter (Signed)
Prepared ENTRESTO 97-103 mg Tablet, Carvedilol (COREG) 6.25 mg Tablet, Atorvastatin (LIPITOR) 20 mg Tablet, Tamsulosin (FLOMAX) 0.4 mg Capsule  LOV:08-22-20  NOV:12-12-21    BP Readings from Last 1 Encounters:   08/22/20 (!) 150/100      Pulse Readings from Last 1 Encounters:   08/22/20 93       No results found for: CHOL, LDLC, HDL, TRIG  Lab Results   Lab Name Value Date/Time    NA 137 06/25/2019 04:06 AM    K 3.8 06/25/2019 04:06 AM    CL 102 06/25/2019 04:06 AM    CO2 25 06/25/2019 04:06 AM    BUN 39 (H) 06/25/2019 04:06 AM    CR 1.57 (H) 06/25/2019 04:06 AM    GLU 113 (H) 06/25/2019 04:06 AM

## 2021-11-01 ENCOUNTER — Ambulatory Visit (HOSPITAL_BASED_OUTPATIENT_CLINIC_OR_DEPARTMENT_OTHER): Payer: Medicare Other | Admitting: Gerontology

## 2021-11-02 ENCOUNTER — Telehealth: Payer: Self-pay | Admitting: Neurology

## 2021-11-02 NOTE — Telephone Encounter (Signed)
Patients wife is not on DPR. Cannot speak to wife.

## 2021-11-02 NOTE — Telephone Encounter (Signed)
Pt's wife called in and left a message with the access nurse wanting to speak with someone regarding the pt's lab results.

## 2021-11-06 ENCOUNTER — Telehealth (HOSPITAL_BASED_OUTPATIENT_CLINIC_OR_DEPARTMENT_OTHER): Payer: Self-pay | Admitting: Cardiovascular Disease

## 2021-11-06 NOTE — Telephone Encounter (Signed)
Pt phone number is invalid, and no Alt Designee on file, sending unable to reach letter. If Pt calls please assist with scheduling Echo and follow up appointment.         FYI

## 2021-11-06 NOTE — Telephone Encounter (Signed)
-----   Message from Lenora Boys sent at 10/31/2021  8:54 AM PDT -----  Regarding: DB-ECHOCARDIOGRAM COMPLETE

## 2021-11-07 ENCOUNTER — Telehealth: Payer: Self-pay | Admitting: Neurology

## 2021-11-07 NOTE — Telephone Encounter (Signed)
Called patient and informed him of his LP results and recommendations from Dr. Tomi Likens. Patient stated that he has seen an improvement with his exercises and will follow up with Dr. Posey Pronto when she returns. Patient will call us if he would like a referral for occupational therapy but at this time he states he is doing well and moving in the right direction. Patient is aware that I will have someone from the front give him a call to get him scheduled with Dr. Posey Pronto when she returns and place him on a wait list. Patient verbalized understanding and had no further questions or concerns.

## 2021-11-07 NOTE — Telephone Encounter (Signed)
Called patient and left a message for a call back.  

## 2021-11-07 NOTE — Telephone Encounter (Signed)
Please see previous encounter

## 2021-11-07 NOTE — Telephone Encounter (Signed)
Patient called back.  Stated he would be home for at least the next hour.

## 2021-11-07 NOTE — Telephone Encounter (Signed)
Pt is returning a call to someone 

## 2021-11-07 NOTE — Telephone Encounter (Signed)
Patient is sch for 05-14-22 and is on the wait list for Dr Posey Pronto

## 2021-11-20 DIAGNOSIS — Z947 Corneal transplant status: Secondary | ICD-10-CM | POA: Diagnosis not present

## 2021-11-20 DIAGNOSIS — H53001 Unspecified amblyopia, right eye: Secondary | ICD-10-CM | POA: Diagnosis not present

## 2021-11-20 DIAGNOSIS — Z09 Encounter for follow-up examination after completed treatment for conditions other than malignant neoplasm: Secondary | ICD-10-CM | POA: Diagnosis not present

## 2021-11-20 DIAGNOSIS — H25813 Combined forms of age-related cataract, bilateral: Secondary | ICD-10-CM | POA: Diagnosis not present

## 2021-11-22 ENCOUNTER — Ambulatory Visit (HOSPITAL_BASED_OUTPATIENT_CLINIC_OR_DEPARTMENT_OTHER): Payer: Medicare Other | Admitting: Nurse Practitioner

## 2021-11-22 NOTE — Telephone Encounter (Signed)
Sending certified letter   FYI

## 2021-12-12 ENCOUNTER — Ambulatory Visit (HOSPITAL_BASED_OUTPATIENT_CLINIC_OR_DEPARTMENT_OTHER): Payer: Medicare Other | Admitting: Cardiovascular Disease

## 2021-12-13 NOTE — Telephone Encounter (Signed)
You left a message with pt's sister yesterday. She called back. See below.

## 2021-12-13 NOTE — Telephone Encounter (Signed)
Pts sister called Pt does not have a phone  number or an alt designee, Sent certified letter with no response.       Please Advise

## 2021-12-22 NOTE — Telephone Encounter (Signed)
I thought we already decided he was lost to follow-up.  Let his primary care physician know that we cannot get a hold of him and have him contact us if he comes into see them thanks

## 2021-12-22 NOTE — Telephone Encounter (Signed)
What would you like to do with this patient?

## 2021-12-22 NOTE — Telephone Encounter (Signed)
No PCP on file 

## 2021-12-22 NOTE — Telephone Encounter (Signed)
Pt does not have a phone, CP front office talked to Pt sister and she stated Pt does not have phone. Mail came back.     Please Advise.

## 2022-02-08 DIAGNOSIS — I10 Essential (primary) hypertension: Secondary | ICD-10-CM | POA: Diagnosis not present

## 2022-02-08 DIAGNOSIS — E7849 Other hyperlipidemia: Secondary | ICD-10-CM | POA: Diagnosis not present

## 2022-02-08 DIAGNOSIS — E559 Vitamin D deficiency, unspecified: Secondary | ICD-10-CM | POA: Diagnosis not present

## 2022-02-08 DIAGNOSIS — Z1329 Encounter for screening for other suspected endocrine disorder: Secondary | ICD-10-CM | POA: Diagnosis not present

## 2022-02-08 DIAGNOSIS — R7301 Impaired fasting glucose: Secondary | ICD-10-CM | POA: Diagnosis not present

## 2022-02-12 ENCOUNTER — Encounter: Payer: Self-pay | Admitting: Neurology

## 2022-02-12 ENCOUNTER — Ambulatory Visit: Payer: Medicare HMO | Admitting: Neurology

## 2022-02-12 VITALS — BP 153/82 | HR 102 | Ht 73.5 in | Wt 200.0 lb

## 2022-02-12 DIAGNOSIS — G629 Polyneuropathy, unspecified: Secondary | ICD-10-CM

## 2022-02-12 DIAGNOSIS — G5612 Other lesions of median nerve, left upper limb: Secondary | ICD-10-CM

## 2022-02-12 DIAGNOSIS — G5622 Lesion of ulnar nerve, left upper limb: Secondary | ICD-10-CM

## 2022-02-12 NOTE — Progress Notes (Signed)
Follow-up Visit   Date: 02/12/2022    Joshua Walters MRN: [id number] DOB: 29-Apr-1951    Joshua Walters is a 71 y.o. right-handed Caucasian male with GERD, hypetension, and hyperlidemia returning to the clinic for follow-up of left hand weakness.  The patient was accompanied to the clinic by wife who also provides collateral information.    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  There is presence of demyelinating and axonal changes which raises concern for an acquired process.  No findings to suggest brachial plexopathy. Absence of severe pain makes mononeuritis multiplex unlikely.  CSF protein was normal arguing against chronic inflammatory demyelinating polyradiculoneuropathy.  Fortunately, there has been mild improvement with finger flexion and extension.  Weakness with finger abductors/adductors/ and ABP is stable.  To be sure there is no compressive pathology of the nerve, I will order nerve ultrasound of the left median and ulnar nerves. Laboratory testing notable for vitamin B12 and B1 deficiency.  He is taking B1 150m and B12 10029m daily.  Encouraged to reduce alcohol consumption Continue home OT exercises  Return to clinic in 4 months  --------------------------------------------- History of present illness: Starting in September 2022, he developed left shoulder pain, which lasted about a week.  MRI left shoulder showed a complete tear of the supraspinatus tendon and small full thickness tear of the anterior aspect of the infraspinatus tendon.  He was doing PT for shoulder and during this time noticed weakness and atophy of the left hand.  He did not have sudden weakness of the hand.  He recalls also having reduced sensation in the hand, no tingling or pain.  No neck pain or radicular symptoms.  He was referred for EMG which shows severe left median and ulnar neuropathy,  with demyelinating and axonal features, nonlocalizable.  He was referred here for further evaluation. He feels that since the EMG, the strength in his hand has improved.  He is able to use his hand better, such as tying shoes, manipulating zippers/buttons, and using yard tools.    UPDATE 02/12/2022:   He continues to have left hand weakness, but feels that his grip has improved and he is now able to carry a gallon of milk and hold yard tools.  He is no longer dropping items.  He has occasional tingling in the left arm.  He had CSF testing which was normal.  Labs shows low vitamin B12 and B1.  He drinks 3-4 beers night most nights.   Medications:  Current Outpatient Medications on File Prior to Visit  Medication Sig Dispense Refill   Cholecalciferol (VITAMIN D3) 25 MCG (1000 UT) CAPS Take by mouth once.     famotidine (PEPCID) 20 MG tablet Take 20 mg by mouth 2 (two) times daily.     lisinopril (ZESTRIL) 10 MG tablet Take 10 mg by mouth daily.     Omega-3 1000 MG CAPS Take by mouth.     rosuvastatin (CRESTOR) 20 MG tablet Take 20 mg by mouth at bedtime.     thiamine (VITAMIN B1) 100 MG tablet Take 100 mg by mouth daily.     vitamin B-12 (CYANOCOBALAMIN) 100 MCG tablet Take 100 mcg by mouth daily.     acetaminophen (TYLENOL) 500 MG tablet Take 1,000 mg by mouth every 6 (six) hours as needed for moderate pain. (Patient not taking: Reported on 12/15/2019)     No current facility-administered medications on  file prior to visit.    Allergies:  Allergies  Allergen Reactions   Penicillins        Sulfa Antibiotics Itching and Rash    Blood pressure dropped    Vital Signs:  BP (!) 153/82   Pulse (!) 102   Ht 6' 1.5" (1.867 m)   Wt 200 lb (90.7 kg)   SpO2 98%   BMI 26.03 kg/m    Neurological Exam: MENTAL STATUS including orientation to time, place, person, recent and remote memory, attention span and concentration, language, and fund of knowledge is normal.  Speech is not  dysarthric.  CRANIAL NERVES:  No visual field defects.  Pupils equal round and reactive to light.  Normal conjugate, extra-ocular eye movements in all directions of gaze.  No ptosis.  Face is symmetric. Palate elevates symmetrically.  Tongue is midline.  MOTOR:   Moderate left intrinsic hand muscle and forearm atrophy.    MOTOR:  No atrophy, fasciculations or abnormal movements.  No pronator drift.   Upper Extremity:  Right  Left  Deltoid  5/5   5/5   Biceps  5/5   5/5   Triceps  5/5   5/5   Infraspinatus 5/5  5/5  Medial pectoralis 5/5  5/5  Wrist extensors  5/5   5/5   Wrist flexors  5/5   5/5   Finger extensors * 5/5   5/5   Finger flexors * 5/5   5/5   Dorsal interossei  5/5   4/5   Abductor pollicis  5/5   4/5   Tone (Ashworth scale)  0  0   Lower Extremity:  Right  Left  Hip flexors  5/5   5/5   Hip extensors  5/5   5/5   Dorsiflexors  5/5   5/5   Plantarflexors  5/5   5/5   Toe extensors  5/5   5/5   Toe flexors  5/5   5/5   Tone (Ashworth scale)  0  0   MSRs:  Reflexes are 2+/4 throughout .  SENSORY:  Intact to vibration and pin prick throughout.  COORDINATION/GAIT:  Normal finger-to- nose-finger.  Intact rapid alternating movements bilaterally.  Gait narrow based and stable.   Data: NCS/EMG of the left arm 07/04/2021: The electrophysiologic findings are consistent with a severe left median and ulnar neuropathy, with axonal and demyelinating features.  A polyneuropathy seems less likely with normal radial sensory response.  Neurological consultation for further evaluation is recommended. There is no evidence of a brachial plexopathy or cervical radiculopathy.   CSF 09/27/2021:  R0 W0 P52 G60  IgG index 0.55, OCB 5 identical bands, ACE 2 Labs 08/30/2021:  CRP <1.0, ESR 17, TSH 0.9, B12 273, folate 11, vitamin B1 <7, ANA neg, ANCA neg, SPEP with IFE no M protein, copper 86   Thank you for allowing me to participate in patient's care.  If I can answer any additional  questions, I would be pleased to do so.    Sincerely,    Melea Prezioso K. Posey Pronto, DO

## 2022-02-12 NOTE — Patient Instructions (Addendum)
Nerve ultrasound with Dr. Berdine Addison  Start home exercises for left hand  Try to reduce alcohol consumption  Continue vitamin B12 and B1 supplements  Return to clinic in 4 months

## 2022-02-19 DIAGNOSIS — C61 Malignant neoplasm of prostate: Secondary | ICD-10-CM | POA: Diagnosis not present

## 2022-02-19 DIAGNOSIS — R7301 Impaired fasting glucose: Secondary | ICD-10-CM | POA: Diagnosis not present

## 2022-02-19 DIAGNOSIS — G6289 Other specified polyneuropathies: Secondary | ICD-10-CM | POA: Diagnosis not present

## 2022-02-19 DIAGNOSIS — E7849 Other hyperlipidemia: Secondary | ICD-10-CM | POA: Diagnosis not present

## 2022-02-19 DIAGNOSIS — Z23 Encounter for immunization: Secondary | ICD-10-CM | POA: Diagnosis not present

## 2022-02-19 DIAGNOSIS — E875 Hyperkalemia: Secondary | ICD-10-CM | POA: Diagnosis not present

## 2022-02-19 DIAGNOSIS — M19012 Primary osteoarthritis, left shoulder: Secondary | ICD-10-CM | POA: Diagnosis not present

## 2022-02-19 DIAGNOSIS — Z6826 Body mass index (BMI) 26.0-26.9, adult: Secondary | ICD-10-CM | POA: Diagnosis not present

## 2022-02-19 DIAGNOSIS — I10 Essential (primary) hypertension: Secondary | ICD-10-CM | POA: Diagnosis not present

## 2022-02-21 ENCOUNTER — Encounter: Payer: Self-pay | Admitting: Neurology

## 2022-02-22 ENCOUNTER — Telehealth: Payer: Self-pay

## 2022-02-22 NOTE — Telephone Encounter (Signed)
Called [employer] to initiate U/S PA for ultrasound cpt 76882/76883 and was on hold for 18 minutes and was hung up on.   Called [employer] back and got a hold of a representative only to be told their systems are currently down at the moment.

## 2022-02-23 NOTE — Telephone Encounter (Signed)
[employer]. No PA required for Cpt#:76882 & [phone]. Reference #: [id number].

## 2022-03-26 ENCOUNTER — Other Ambulatory Visit: Payer: Self-pay

## 2022-03-26 DIAGNOSIS — G5622 Lesion of ulnar nerve, left upper limb: Secondary | ICD-10-CM

## 2022-03-26 DIAGNOSIS — G5612 Other lesions of median nerve, left upper limb: Secondary | ICD-10-CM

## 2022-03-29 ENCOUNTER — Ambulatory Visit (HOSPITAL_BASED_OUTPATIENT_CLINIC_OR_DEPARTMENT_OTHER): Payer: Medicare Other | Admitting: Cardiovascular Disease

## 2022-03-30 DIAGNOSIS — Z0100 Encounter for examination of eyes and vision without abnormal findings: Secondary | ICD-10-CM | POA: Diagnosis not present

## 2022-03-30 DIAGNOSIS — C61 Malignant neoplasm of prostate: Secondary | ICD-10-CM | POA: Diagnosis not present

## 2022-04-06 DIAGNOSIS — C61 Malignant neoplasm of prostate: Secondary | ICD-10-CM | POA: Diagnosis not present

## 2022-04-11 DIAGNOSIS — Z23 Encounter for immunization: Secondary | ICD-10-CM | POA: Diagnosis not present

## 2022-04-17 ENCOUNTER — Ambulatory Visit: Payer: Medicare HMO | Admitting: Neurology

## 2022-04-17 DIAGNOSIS — G5612 Other lesions of median nerve, left upper limb: Secondary | ICD-10-CM | POA: Diagnosis not present

## 2022-04-17 DIAGNOSIS — G5622 Lesion of ulnar nerve, left upper limb: Secondary | ICD-10-CM

## 2022-04-17 NOTE — Procedures (Addendum)
  [facility]  [address], [address]  [place], [place] [address] Tel: [phone] Fax: [phone] Test Date:  04/17/2022  Patient: Hipolito Martinezlopez DOB: 15-Apr-1951 Physician: Kai Levins, MD  Sex: Male Height: 6' 1.5" Ref Phys: Narda Amber, DO  ID#: [id number]   Technician:    History: This is a 71 year old male with left hand weakness and atrophy.  Findings: High frequency (4.0-16.0 MHz) B-mode, nonvascular ultrasound of the left upper extremity shows: Cross sectional areas (CSA) of the left median nerve from palm to mid upper arm is within normal limits. CSAs of the left ulnar nerve from wrist to mid upper arm is within normal limits.  Impression: This is a normal ultrasound. Specifically, there is no ultrasonographic evidence of entrapment of the left median nerve at the wrist segment, or of entrapment/repetitive nerve trauma of the left ulnar nerve at the elbow segment. No other obvious lesion involving the adjacent bone or tendon is identified. No definite vascular abnormalities.  Nerve Measurements   Site Area Segment Area Ratio Mobility Vascularity Comment   mm Norm   Norm     Left Median  Palm 8.0         Wrist 7.6  < 13.0         Forearm 6.7  < 10.7  Wrist - Forearm 1.13  < 1.50      Pronator teres 8.5  < 11.0         Mid-arm 8.6  < 13.1         Left Ulnar  Wrist 4.4  < 10.0         Forearm 4.8  < 10.0  Elbow - Forearm 1.69      Distal elbow 5.3  < 10.0  Elbow - upper arm 1.07      Elbow 8.0  < 10.0         Proximal elbow 8.1  < 10.0         Mid-arm 7.6  < 10.0          Ultrasound Images:

## 2022-05-14 ENCOUNTER — Ambulatory Visit: Payer: Medicare HMO | Admitting: Neurology

## 2022-06-06 MED ORDER — CARVEDILOL 6.25 MG TABLET: 6.2500 mg | ORAL_TABLET | Freq: Two times a day (BID) | ORAL | 3 refills | Status: AC

## 2022-06-06 MED ORDER — ATORVASTATIN 20 MG TABLET: 20.0000 mg | ORAL_TABLET | Freq: Every day | ORAL | 3 refills | Status: AC

## 2022-06-06 MED ORDER — ASPIRIN 81 MG TABLET,DELAYED RELEASE: 81.0000 mg | DELAYED_RELEASE_TABLET | Freq: Every day | ORAL | 3 refills | Status: AC

## 2022-06-15 ENCOUNTER — Ambulatory Visit: Payer: Medicare HMO | Admitting: Neurology

## 2022-07-16 ENCOUNTER — Ambulatory Visit: Payer: Medicare HMO | Admitting: Neurology

## 2022-07-16 ENCOUNTER — Encounter: Payer: Self-pay | Admitting: Neurology

## 2022-07-16 VITALS — BP 171/83 | HR 104 | Ht 73.5 in | Wt 197.0 lb

## 2022-07-16 DIAGNOSIS — G5612 Other lesions of median nerve, left upper limb: Secondary | ICD-10-CM

## 2022-07-16 DIAGNOSIS — G5622 Lesion of ulnar nerve, left upper limb: Secondary | ICD-10-CM

## 2022-07-16 NOTE — Progress Notes (Signed)
Follow-up Visit   Date: 07/16/2022    Joshua Walters MRN: [id number] DOB: December 19, 1950    Joshua Walters is a 72 y.o. right-handed Caucasian male with GERD, hypetension, and hyperlidemia returning to the clinic for follow-up of left hand weakness.  The patient was accompanied to the clinic by wife who also provides collateral information.    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  There is presence of demyelinating and axonal changes which raises concern for an acquired process, however CSF did not demonstrate elevated protein.   No findings to suggest brachial plexopathy. Absence of severe pain makes mononeuritis multiplex unlikely.  Fortunately, symptoms have shown some mild improvement, but there continues to be weakness with finger abductors/adductors and marked atrophy in the hand.  [place] nerve did not show any nerve entrapment. Laboratory testing notable for vitamin B12 and B1 deficiency.  He is taking B1 '100mg'$  and B12 1070mg daily.  At this juncture, I recommend that we continue to monitor  Encouraged to reduce alcohol consumption Continue home OT exercises  Return to clinic in 6 months  --------------------------------------------- History of present illness: Starting in September 2022, he developed left shoulder pain, which lasted about a week.  MRI left shoulder showed a complete tear of the supraspinatus tendon and small full thickness tear of the anterior aspect of the infraspinatus tendon.  He was doing PT for shoulder and during this time noticed weakness and atophy of the left hand.  He did not have sudden weakness of the hand.  He recalls also having reduced sensation in the hand, no tingling or pain.  No neck pain or radicular symptoms.  He was referred for EMG which shows severe left median and ulnar neuropathy, with demyelinating and axonal features, nonlocalizable.   He was referred here for further evaluation. He feels that since the EMG, the strength in his hand has improved.  He is able to use his hand better, such as tying shoes, manipulating zippers/buttons, and using yard tools.    UPDATE 02/12/2022:   He continues to have left hand weakness, but feels that his grip has improved and he is now able to carry a gallon of milk and hold yard tools.  He is no longer dropping items.  He has occasional tingling in the left arm.  He had CSF testing which was normal.  Labs shows low vitamin B12 and B1.  He drinks 3-4 beers night most nights.   UPDATE 07/16/2022:  He is here for follow-up visit.  He continues to have weakness and numbness in the left hand, but feels symptoms may be a little better.  He is able to hold objects better in the left hand.   Certainly, no new weakness in the hands.  No new weakness, numbness, tingling. He has reduced alcohol consumption to 2-3 beers per week.   Medications:  Current Outpatient Medications on File Prior to Visit  Medication Sig Dispense Refill   Cholecalciferol (VITAMIN D3) 25 MCG (1000 UT) CAPS Take by mouth once.     famotidine (PEPCID) 20 MG tablet Take 20 mg by mouth 2 (two) times daily.     lisinopril (ZESTRIL) 10 MG tablet Take 10 mg by mouth daily.     Omega-3 1000 MG CAPS Take by mouth.     rosuvastatin (CRESTOR) 20 MG tablet Take 20 mg by mouth at bedtime.     thiamine (VITAMIN B1)  100 MG tablet Take 100 mg by mouth daily.     vitamin B-12 (CYANOCOBALAMIN) 100 MCG tablet Take 100 mcg by mouth daily.     acetaminophen (TYLENOL) 500 MG tablet Take 1,000 mg by mouth every 6 (six) hours as needed for moderate pain. (Patient not taking: Reported on 07/16/2022)     No current facility-administered medications on file prior to visit.    Allergies:  Allergies  Allergen Reactions   Penicillins        Sulfa Antibiotics Itching and Rash    Blood pressure dropped    Vital Signs:  BP (!) 171/83   Pulse (!) 104   Ht  6' 1.5" (1.867 m)   Wt 197 lb (89.4 kg)   SpO2 100%   BMI 25.64 kg/m    Neurological Exam: MENTAL STATUS including orientation to time, place, person, recent and remote memory, attention span and concentration, language, and fund of knowledge is normal.  Speech is not dysarthric.  CRANIAL NERVES:  .  Pupils equal round and reactive to light.  Normal conjugate, extra-ocular eye movements in all directions of gaze.  No ptosis.  Face is symmetric.   MOTOR:   Moderate left intrinsic hand muscle and forearm atrophy.   No pronator drift.   Upper Extremity:  Right  Left  Deltoid  5/5   5/5   Biceps  5/5   5/5   Triceps  5/5   5/5   Wrist extensors  5/5   5/5   Wrist flexors  5/5   5/5   Finger extensors  5/5   5/5   Finger flexors  5/5   5-/5   Dorsal interossei  5/5   4/5   Abductor pollicis  5/5   4/5   Tone (Ashworth scale)  0  0   Lower Extremity:  Right  Left  Hip flexors  5/5   5/5   Hip extensors  5/5   5/5   Dorsiflexors  5/5   5/5   Plantarflexors  5/5   5/5   Toe extensors  5/5   5/5   Toe flexors  5/5   5/5   Tone (Ashworth scale)  0  0   MSRs:  Reflexes are 2+/4 throughout .  SENSORY:  Intact to vibration and pin prick throughout.  COORDINATION/GAIT:  Normal finger-to- nose-finger.  Intact rapid alternating movements bilaterally.  Gait narrow based and stable.   Data: NCS/EMG of the left arm 07/04/2021: The electrophysiologic findings are consistent with a severe left median and ulnar neuropathy, with axonal and demyelinating features.  A polyneuropathy seems less likely with normal radial sensory response.  Neurological consultation for further evaluation is recommended. There is no evidence of a brachial plexopathy or cervical radiculopathy.   CSF 09/27/2021:  R0 W0 P52 G60  IgG index 0.55, OCB 5 identical bands, ACE 2 Labs 08/30/2021:  CRP <1.0, ESR 17, TSH 0.9, B12 273, folate 11, vitamin B1 <7, ANA neg, ANCA neg, SPEP with IFE no M protein, copper 86  [place] nerve  04/17/2022: This is a normal ultrasound. Specifically, there is no ultrasonographic evidence of entrapment of the left median nerve at the wrist segment, or of entrapment/repetitive nerve trauma of the left ulnar nerve at the elbow segment. No other obvious lesion involving the adjacent bone or tendon is identified. No definite vascular abnormalities.   Total time spent reviewing records, interview, history/exam, documentation, and coordination of care on day of encounter:  20 min  Thank you for allowing me to participate in patient's care.  If I can answer any additional questions, I would be pleased to do so.    Sincerely,    Santiago Stenzel K. Posey Pronto, DO

## 2022-07-16 NOTE — Patient Instructions (Signed)
Continue your home exercises  Return to clinic in 6 months

## 2022-08-08 MED ORDER — ALBUTEROL SULFATE HFA 90 MCG/ACTUATION AEROSOL INHALER: 1.0000 | INHALATION_SPRAY | RESPIRATORY_TRACT | 6 refills | Status: AC | PRN

## 2022-08-14 DIAGNOSIS — C61 Malignant neoplasm of prostate: Secondary | ICD-10-CM | POA: Diagnosis not present

## 2022-08-14 DIAGNOSIS — I10 Essential (primary) hypertension: Secondary | ICD-10-CM | POA: Diagnosis not present

## 2022-08-14 DIAGNOSIS — E7849 Other hyperlipidemia: Secondary | ICD-10-CM | POA: Diagnosis not present

## 2022-08-14 DIAGNOSIS — R7301 Impaired fasting glucose: Secondary | ICD-10-CM | POA: Diagnosis not present

## 2022-08-14 DIAGNOSIS — Z1329 Encounter for screening for other suspected endocrine disorder: Secondary | ICD-10-CM | POA: Diagnosis not present

## 2022-08-14 DIAGNOSIS — E875 Hyperkalemia: Secondary | ICD-10-CM | POA: Diagnosis not present

## 2022-08-14 DIAGNOSIS — Z0001 Encounter for general adult medical examination with abnormal findings: Secondary | ICD-10-CM | POA: Diagnosis not present

## 2022-08-20 DIAGNOSIS — R7301 Impaired fasting glucose: Secondary | ICD-10-CM | POA: Diagnosis not present

## 2022-08-20 DIAGNOSIS — E7849 Other hyperlipidemia: Secondary | ICD-10-CM | POA: Diagnosis not present

## 2022-08-20 DIAGNOSIS — C61 Malignant neoplasm of prostate: Secondary | ICD-10-CM | POA: Diagnosis not present

## 2022-08-20 DIAGNOSIS — G6289 Other specified polyneuropathies: Secondary | ICD-10-CM | POA: Diagnosis not present

## 2022-08-20 DIAGNOSIS — Z23 Encounter for immunization: Secondary | ICD-10-CM | POA: Diagnosis not present

## 2022-08-20 DIAGNOSIS — M75102 Unspecified rotator cuff tear or rupture of left shoulder, not specified as traumatic: Secondary | ICD-10-CM | POA: Diagnosis not present

## 2022-08-20 DIAGNOSIS — I10 Essential (primary) hypertension: Secondary | ICD-10-CM | POA: Diagnosis not present

## 2022-08-20 DIAGNOSIS — Z6826 Body mass index (BMI) 26.0-26.9, adult: Secondary | ICD-10-CM | POA: Diagnosis not present

## 2022-08-20 DIAGNOSIS — Z0001 Encounter for general adult medical examination with abnormal findings: Secondary | ICD-10-CM | POA: Diagnosis not present

## 2022-08-20 DIAGNOSIS — M19012 Primary osteoarthritis, left shoulder: Secondary | ICD-10-CM | POA: Diagnosis not present

## 2022-11-21 DIAGNOSIS — H25813 Combined forms of age-related cataract, bilateral: Secondary | ICD-10-CM | POA: Diagnosis not present

## 2022-11-21 DIAGNOSIS — H53002 Unspecified amblyopia, left eye: Secondary | ICD-10-CM | POA: Diagnosis not present

## 2022-11-21 DIAGNOSIS — Z947 Corneal transplant status: Secondary | ICD-10-CM | POA: Diagnosis not present

## 2023-01-14 ENCOUNTER — Ambulatory Visit: Payer: Medicare HMO | Admitting: Neurology

## 2023-02-11 DIAGNOSIS — I10 Essential (primary) hypertension: Secondary | ICD-10-CM | POA: Diagnosis not present

## 2023-02-11 DIAGNOSIS — E875 Hyperkalemia: Secondary | ICD-10-CM | POA: Diagnosis not present

## 2023-02-11 DIAGNOSIS — R7301 Impaired fasting glucose: Secondary | ICD-10-CM | POA: Diagnosis not present

## 2023-02-11 DIAGNOSIS — E7849 Other hyperlipidemia: Secondary | ICD-10-CM | POA: Diagnosis not present

## 2023-02-11 DIAGNOSIS — E559 Vitamin D deficiency, unspecified: Secondary | ICD-10-CM | POA: Diagnosis not present

## 2023-02-11 DIAGNOSIS — R5381 Other malaise: Secondary | ICD-10-CM | POA: Diagnosis not present

## 2023-02-18 DIAGNOSIS — R7301 Impaired fasting glucose: Secondary | ICD-10-CM | POA: Diagnosis not present

## 2023-02-18 DIAGNOSIS — Z23 Encounter for immunization: Secondary | ICD-10-CM | POA: Diagnosis not present

## 2023-02-18 DIAGNOSIS — C61 Malignant neoplasm of prostate: Secondary | ICD-10-CM | POA: Diagnosis not present

## 2023-02-18 DIAGNOSIS — G4700 Insomnia, unspecified: Secondary | ICD-10-CM | POA: Diagnosis not present

## 2023-02-18 DIAGNOSIS — I10 Essential (primary) hypertension: Secondary | ICD-10-CM | POA: Diagnosis not present

## 2023-02-18 DIAGNOSIS — E7849 Other hyperlipidemia: Secondary | ICD-10-CM | POA: Diagnosis not present

## 2023-02-18 DIAGNOSIS — Z6825 Body mass index (BMI) 25.0-25.9, adult: Secondary | ICD-10-CM | POA: Diagnosis not present

## 2023-02-21 DIAGNOSIS — Z23 Encounter for immunization: Secondary | ICD-10-CM | POA: Diagnosis not present

## 2023-06-08 DIAGNOSIS — Z6826 Body mass index (BMI) 26.0-26.9, adult: Secondary | ICD-10-CM | POA: Diagnosis not present

## 2023-06-08 DIAGNOSIS — S40021A Contusion of right upper arm, initial encounter: Secondary | ICD-10-CM | POA: Diagnosis not present

## 2023-08-12 ENCOUNTER — Other Ambulatory Visit: Payer: Self-pay

## 2023-08-12 ENCOUNTER — Emergency Department (HOSPITAL_COMMUNITY)
Admission: EM | Admit: 2023-08-12 | Discharge: 2023-08-13 | Disposition: A | Discharge status: Home / Self Care | Attending: Emergency Medicine | Admitting: Emergency Medicine

## 2023-08-12 DIAGNOSIS — N200 Calculus of kidney: Secondary | ICD-10-CM | POA: Diagnosis not present

## 2023-08-12 DIAGNOSIS — R109 Unspecified abdominal pain: Secondary | ICD-10-CM | POA: Diagnosis not present

## 2023-08-12 DIAGNOSIS — I7143 Infrarenal abdominal aortic aneurysm, without rupture: Secondary | ICD-10-CM | POA: Diagnosis not present

## 2023-08-12 DIAGNOSIS — N132 Hydronephrosis with renal and ureteral calculous obstruction: Secondary | ICD-10-CM | POA: Diagnosis not present

## 2023-08-12 DIAGNOSIS — K5730 Diverticulosis of large intestine without perforation or abscess without bleeding: Secondary | ICD-10-CM | POA: Diagnosis not present

## 2023-08-12 LAB — COMPREHENSIVE METABOLIC PANEL
ALT: 22 U/L (ref 0–44)
AST: 24 U/L (ref 15–41)
Albumin: 3.9 g/dL (ref 3.5–5.0)
Alkaline Phosphatase: 49 U/L (ref 38–126)
Anion gap: 11 (ref 5–15)
BUN: 10 mg/dL (ref 8–23)
CO2: 24 mmol/L (ref 22–32)
Calcium: 9.2 mg/dL (ref 8.9–10.3)
Chloride: 99 mmol/L (ref 98–111)
Creatinine, Ser: 0.91 mg/dL (ref 0.61–1.24)
GFR, Estimated: >60 mL/min (ref >60)
Glucose, Bld: 144 mg/dL — ABNORMAL HIGH (ref 70–99)
Potassium: 4.3 mmol/L (ref 3.5–5.1)
Sodium: 134 mmol/L — ABNORMAL LOW (ref 135–145)
Total Bilirubin: 0.5 mg/dL (ref 0.0–1.2)
Total Protein: 6.5 g/dL (ref 6.5–8.1)

## 2023-08-12 LAB — URINALYSIS, ROUTINE W REFLEX MICROSCOPIC
Bacteria, UA: NONE SEEN
Bilirubin Urine: NEGATIVE
Glucose, UA: NEGATIVE mg/dL
Ketones, ur: NEGATIVE mg/dL
Leukocytes,Ua: NEGATIVE
Nitrite: NEGATIVE
Protein, ur: 30 mg/dL — AB
RBC / HPF: >50 RBC/hpf (ref 0–5)
Specific Gravity, Urine: 1.013 (ref 1.005–1.030)
pH: 5 (ref 5.0–8.0)

## 2023-08-12 LAB — CBC
HCT: 43 % (ref 39.0–52.0)
Hemoglobin: 14 g/dL (ref 13.0–17.0)
MCH: 31.5 pg (ref 26.0–34.0)
MCHC: 32.6 g/dL (ref 30.0–36.0)
MCV: 96.8 fL (ref 80.0–100.0)
Platelets: 269 10*3/uL (ref 150–400)
RBC: 4.44 MIL/uL (ref 4.22–5.81)
RDW: 12.4 % (ref 11.5–15.5)
WBC: 10.7 10*3/uL — ABNORMAL HIGH (ref 4.0–10.5)
nRBC: 0 % (ref 0.0–0.2)

## 2023-08-12 LAB — LIPASE, BLOOD: Lipase: 42 U/L (ref 11–51)

## 2023-08-12 NOTE — ED Triage Notes (Signed)
 Pt c/o left flank pain that is now radiating to his left groin, endorses nausea. Hx of kidney stones

## 2023-08-13 ENCOUNTER — Emergency Department (HOSPITAL_COMMUNITY)

## 2023-08-13 DIAGNOSIS — I7143 Infrarenal abdominal aortic aneurysm, without rupture: Secondary | ICD-10-CM | POA: Diagnosis not present

## 2023-08-13 DIAGNOSIS — K5730 Diverticulosis of large intestine without perforation or abscess without bleeding: Secondary | ICD-10-CM | POA: Diagnosis not present

## 2023-08-13 DIAGNOSIS — N132 Hydronephrosis with renal and ureteral calculous obstruction: Secondary | ICD-10-CM | POA: Diagnosis not present

## 2023-08-13 MED ORDER — KETOROLAC TROMETHAMINE 30 MG/ML IJ SOLN
30.0000 mg | Freq: Once | INTRAMUSCULAR | Status: AC
  Administered 2023-08-13: 30 mg via INTRAVENOUS
  Filled 2023-08-13: qty 1

## 2023-08-13 MED ORDER — TAMSULOSIN HCL 0.4 MG PO CAPS: 0.4000 mg | ORAL_CAPSULE | Freq: Every day | ORAL | 0 refills | Status: AC

## 2023-08-13 MED ORDER — OXYCODONE-ACETAMINOPHEN 5-325 MG PO TABS: 1.0000 | ORAL_TABLET | Freq: Four times a day (QID) | ORAL | 0 refills | Status: AC | PRN

## 2023-08-13 MED ORDER — ONDANSETRON HCL 4 MG/2ML IJ SOLN
4.0000 mg | Freq: Once | INTRAMUSCULAR | Status: AC
  Administered 2023-08-13: 4 mg via INTRAVENOUS
  Filled 2023-08-13: qty 2

## 2023-08-13 MED ORDER — MORPHINE SULFATE (PF) 4 MG/ML IV SOLN
4.0000 mg | Freq: Once | INTRAVENOUS | Status: AC
  Administered 2023-08-13: 4 mg via INTRAVENOUS
  Filled 2023-08-13: qty 1

## 2023-08-13 NOTE — Discharge Instructions (Addendum)
 Begin taking Flomax as prescribed.  Begin taking Percocet as prescribed as needed for pain.  Follow-up with urology in the next few days.  The contact information for Dr. Ronne Binning has been provided in this discharge summary for you to call and make these arrangements.  Return to the emergency department if you develop high fevers, worsening pain, or for other new and concerning symptoms.

## 2023-08-13 NOTE — ED Notes (Signed)
 ED Provider at bedside.

## 2023-08-13 NOTE — ED Provider Notes (Signed)
  EMERGENCY DEPARTMENT AT [facility] Provider Note   CSN: [id number] Arrival date & time: 08/12/23  2230     History  Chief Complaint  Patient presents with   Abdominal Pain    Joshua Walters is a 73 y.o. male.  Patient is a 73 year old male with history of kidney stones.  Patient presenting today for evaluation of left flank pain.  This started this afternoon and has worsened throughout the day.  He describes pain radiating to the left groin.  He denies any blood in his urine.  No fevers or chills.  No bowel complaints.  No aggravating or alleviating factors.  The history is provided by the patient.       Home Medications Prior to Admission medications   Medication Sig Start Date End Date Taking? Authorizing Provider  acetaminophen (TYLENOL) 500 MG tablet Take 1,000 mg by mouth every 6 (six) hours as needed for moderate pain. Patient not taking: Reported on 07/16/2022    [provider]  Cholecalciferol (VITAMIN D3) 25 MCG (1000 UT) CAPS Take by mouth once.    [provider]  famotidine (PEPCID) 20 MG tablet Take 20 mg by mouth 2 (two) times daily.    [provider]  lisinopril (ZESTRIL) 10 MG tablet Take 10 mg by mouth daily. 07/13/21   [provider]  Omega-3 1000 MG CAPS Take by mouth.    [provider]  rosuvastatin (CRESTOR) 20 MG tablet Take 20 mg by mouth at bedtime. 05/02/20   [provider]  thiamine (VITAMIN B1) 100 MG tablet Take 100 mg by mouth daily.    [provider]  vitamin B-12 (CYANOCOBALAMIN) 100 MCG tablet Take 100 mcg by mouth daily.    [provider]      Allergies    Penicillins and Sulfa antibiotics    Review of Systems   Review of Systems  All other systems reviewed and are negative.   Physical Exam Updated Vital Signs BP (!) 174/92 (BP Location: Right Arm)   Pulse 94   Temp 98.6 F (37 C) (Oral)   Resp 16   Wt 88 kg   SpO2 98%   BMI 25.25  kg/m  Physical Exam Vitals and nursing note reviewed.  Constitutional:      General: He is not in acute distress.    Appearance: He is well-developed. He is not diaphoretic.  HENT:     Head: Normocephalic and atraumatic.  Cardiovascular:     Rate and Rhythm: Normal rate and regular rhythm.     Heart sounds: No murmur heard.    No friction rub.  Pulmonary:     Effort: Pulmonary effort is normal. No respiratory distress.     Breath sounds: Normal breath sounds. No wheezing or rales.  Abdominal:     General: Bowel sounds are normal. There is no distension.     Palpations: Abdomen is soft.     Tenderness: There is abdominal tenderness in the left lower quadrant. There is left CVA tenderness. There is no right CVA tenderness, guarding or rebound.  Musculoskeletal:        General: Normal range of motion.     Cervical back: Normal range of motion and neck supple.  Skin:    General: Skin is warm and dry.  Neurological:     Mental Status: He is alert and oriented to person, place, and time.     Coordination: Coordination normal.     ED Results /  Procedures / Treatments   Labs (all labs ordered are listed, but only abnormal results are displayed) Labs Reviewed  COMPREHENSIVE METABOLIC PANEL - Abnormal; Notable for the following components:      Result Value   Sodium 134 (*)    Glucose, Bld 144 (*)    All other components within normal limits  CBC - Abnormal; Notable for the following components:   WBC 10.7 (*)    All other components within normal limits  URINALYSIS, ROUTINE W REFLEX MICROSCOPIC - Abnormal; Notable for the following components:   Hgb urine dipstick LARGE (*)    Protein, ur 30 (*)    All other components within normal limits  LIPASE, BLOOD    EKG None  Radiology No results found.  Procedures Procedures    Medications Ordered in ED Medications - No data to display  ED Course/ Medical Decision Making/ A&P  Patient is a 73 year old male with history  of prior kidney stones.  Patient presenting with left flank pain.  Patient arrives with stable vital signs and is afebrile.  There is left-sided CVA tenderness, but abdomen is benign.  Laboratory studies obtained including CBC, CMP, and lipase, all of which are basically unremarkable.  Urinalysis shows large hemoglobin.  CT scan with renal protocol obtained showing a moderate-sized kidney stone in the mid to proximal ureter with hydronephrosis.  Patient given Toradol and morphine for pain along with Zofran for nausea and is feeling considerably better.  Patient will be discharged with pain medication, Flomax, and follow-up with urology if the stone has not passed.  Final Clinical Impression(s) / ED Diagnoses Final diagnoses:  None    Rx / DC Orders ED Discharge Orders     None         Geoffery Lyons, MD 08/13/23 [phone]

## 2023-11-21 DIAGNOSIS — X32XXXA Exposure to sunlight, initial encounter: Secondary | ICD-10-CM | POA: Diagnosis not present

## 2023-11-21 DIAGNOSIS — B078 Other viral warts: Secondary | ICD-10-CM | POA: Diagnosis not present

## 2023-11-21 DIAGNOSIS — L570 Actinic keratosis: Secondary | ICD-10-CM | POA: Diagnosis not present

## 2023-11-27 DIAGNOSIS — Z947 Corneal transplant status: Secondary | ICD-10-CM | POA: Diagnosis not present

## 2023-11-27 DIAGNOSIS — H53002 Unspecified amblyopia, left eye: Secondary | ICD-10-CM | POA: Diagnosis not present

## 2023-11-27 DIAGNOSIS — H25813 Combined forms of age-related cataract, bilateral: Secondary | ICD-10-CM | POA: Diagnosis not present

## 2023-11-27 DIAGNOSIS — H43813 Vitreous degeneration, bilateral: Secondary | ICD-10-CM | POA: Diagnosis not present

## 2024-01-03 DIAGNOSIS — H25812 Combined forms of age-related cataract, left eye: Secondary | ICD-10-CM | POA: Diagnosis not present

## 2024-01-03 DIAGNOSIS — Z947 Corneal transplant status: Secondary | ICD-10-CM | POA: Diagnosis not present

## 2024-01-14 DIAGNOSIS — H25812 Combined forms of age-related cataract, left eye: Secondary | ICD-10-CM | POA: Diagnosis not present

## 2024-01-14 DIAGNOSIS — H538 Other visual disturbances: Secondary | ICD-10-CM | POA: Diagnosis not present

## 2024-01-15 DIAGNOSIS — Z9842 Cataract extraction status, left eye: Secondary | ICD-10-CM | POA: Diagnosis not present

## 2024-01-15 DIAGNOSIS — H25811 Combined forms of age-related cataract, right eye: Secondary | ICD-10-CM | POA: Diagnosis not present

## 2024-01-15 DIAGNOSIS — Z947 Corneal transplant status: Secondary | ICD-10-CM | POA: Diagnosis not present

## 2024-01-15 DIAGNOSIS — H53002 Unspecified amblyopia, left eye: Secondary | ICD-10-CM | POA: Diagnosis not present

## 2024-01-15 DIAGNOSIS — Z961 Presence of intraocular lens: Secondary | ICD-10-CM | POA: Diagnosis not present

## 2024-01-29 DIAGNOSIS — Z961 Presence of intraocular lens: Secondary | ICD-10-CM | POA: Diagnosis not present

## 2024-01-29 DIAGNOSIS — Z947 Corneal transplant status: Secondary | ICD-10-CM | POA: Diagnosis not present

## 2024-01-29 DIAGNOSIS — Z9842 Cataract extraction status, left eye: Secondary | ICD-10-CM | POA: Diagnosis not present

## 2024-01-29 DIAGNOSIS — H25811 Combined forms of age-related cataract, right eye: Secondary | ICD-10-CM | POA: Diagnosis not present

## 2024-01-29 DIAGNOSIS — H53002 Unspecified amblyopia, left eye: Secondary | ICD-10-CM | POA: Diagnosis not present

## 2024-03-03 DIAGNOSIS — Z23 Encounter for immunization: Secondary | ICD-10-CM | POA: Diagnosis not present

## 2024-03-04 DIAGNOSIS — Z947 Corneal transplant status: Secondary | ICD-10-CM | POA: Diagnosis not present

## 2024-03-04 DIAGNOSIS — H25811 Combined forms of age-related cataract, right eye: Secondary | ICD-10-CM | POA: Diagnosis not present

## 2024-03-04 DIAGNOSIS — H53002 Unspecified amblyopia, left eye: Secondary | ICD-10-CM | POA: Diagnosis not present

## 2024-03-04 DIAGNOSIS — Z9842 Cataract extraction status, left eye: Secondary | ICD-10-CM | POA: Diagnosis not present

## 2024-03-04 DIAGNOSIS — Z961 Presence of intraocular lens: Secondary | ICD-10-CM | POA: Diagnosis not present

## 2024-03-05 DIAGNOSIS — C61 Malignant neoplasm of prostate: Secondary | ICD-10-CM | POA: Diagnosis not present

## 2024-03-05 DIAGNOSIS — D126 Benign neoplasm of colon, unspecified: Secondary | ICD-10-CM | POA: Diagnosis not present

## 2024-03-05 DIAGNOSIS — I10 Essential (primary) hypertension: Secondary | ICD-10-CM | POA: Diagnosis not present

## 2024-03-05 DIAGNOSIS — E782 Mixed hyperlipidemia: Secondary | ICD-10-CM | POA: Diagnosis not present

## 2024-03-05 DIAGNOSIS — Z6825 Body mass index (BMI) 25.0-25.9, adult: Secondary | ICD-10-CM | POA: Diagnosis not present

## 2024-03-05 DIAGNOSIS — E7849 Other hyperlipidemia: Secondary | ICD-10-CM | POA: Diagnosis not present

## 2024-03-25 DIAGNOSIS — Z23 Encounter for immunization: Secondary | ICD-10-CM | POA: Diagnosis not present

## 2024-04-10 DIAGNOSIS — Z0100 Encounter for examination of eyes and vision without abnormal findings: Secondary | ICD-10-CM | POA: Diagnosis not present

## 2024-04-15 DIAGNOSIS — C61 Malignant neoplasm of prostate: Secondary | ICD-10-CM | POA: Diagnosis not present

## 2024-04-20 IMAGING — XA DG SPINAL PUNCT LUMBAR DIAG WITH FL CT GUIDANCE
1 series · 1 of 1 positions shown · non-contrast
Comparison: none

CLINICAL DATA: Polyneuropathy, diagnostic evaluation

[Series 1: ortho standard · 1 of 1 slices shown]
[im 1/1]
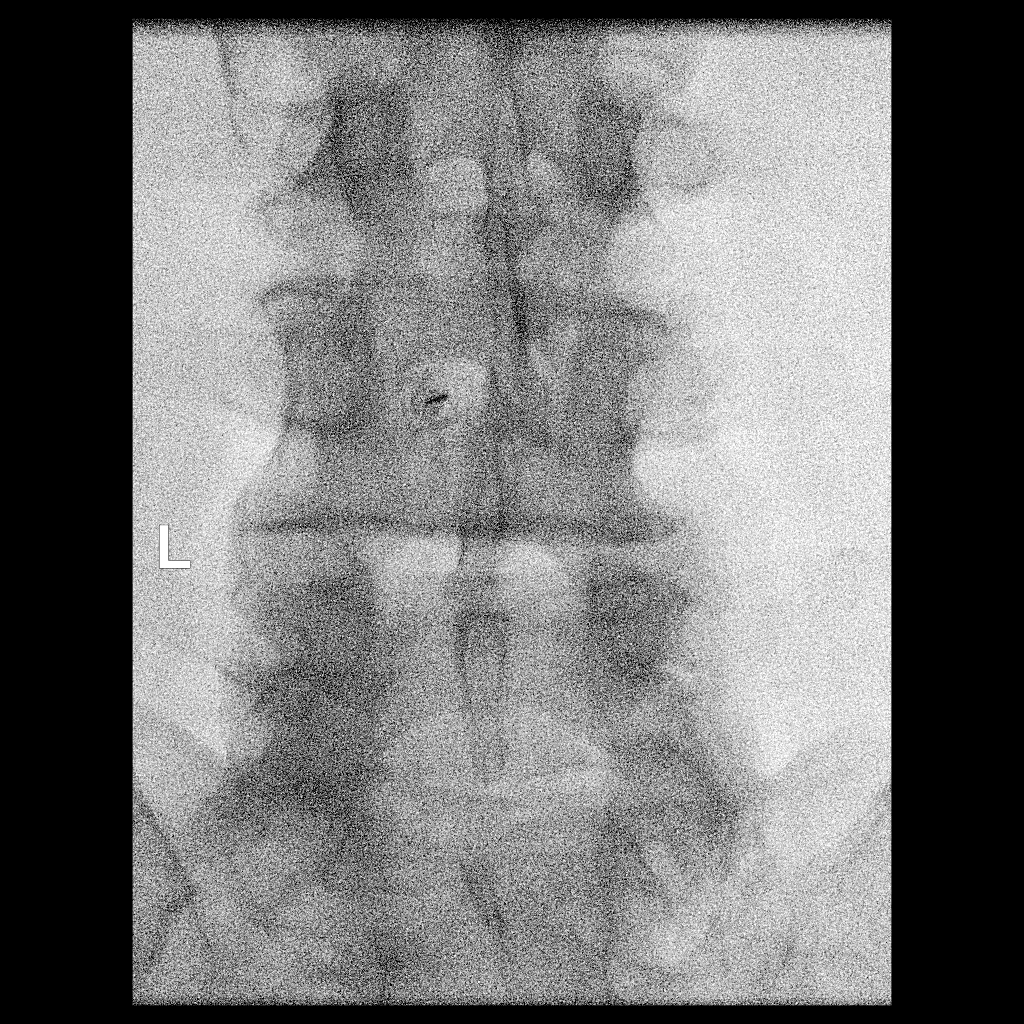

[1 of 1 positions shown; findings below may reference images not displayed]

EXAM:
DIAGNOSTIC LUMBAR PUNCTURE UNDER FLUOROSCOPIC GUIDANCE

FLUOROSCOPY TIME:  Radiation Exposure Index (as provided by the
fluoroscopic device): 0.7 mGy air Kerma

PROCEDURE:
Informed consent was obtained from the patient prior to the
procedure, including potential complications of headache, allergy,
and pain. With the patient prone, the lower back was prepped with
Betadine. 1% Lidocaine was used for local anesthesia. Lumbar
puncture was performed at the L4 level from a left parasagittal
approach using a 20 gauge needle with return of clear colorless CSF
with an opening pressure of less than 15 cm water. 14 ml of CSF were
obtained for laboratory studies. The patient tolerated the procedure
well and there were no apparent complications.
IMPRESSION: Technically successful lumbar puncture under fluoroscopy

## 2024-04-22 DIAGNOSIS — C61 Malignant neoplasm of prostate: Secondary | ICD-10-CM | POA: Diagnosis not present

## 2024-05-21 DIAGNOSIS — L570 Actinic keratosis: Secondary | ICD-10-CM | POA: Diagnosis not present

## 2024-05-21 DIAGNOSIS — X32XXXD Exposure to sunlight, subsequent encounter: Secondary | ICD-10-CM | POA: Diagnosis not present

## 2024-07-23 ENCOUNTER — Ambulatory Visit: Admitting: Radiation Oncology
